# Patient Record
Sex: Female | Born: 1960 | Race: White | Hispanic: Yes | Marital: Single | Smoking: Never smoker
Health system: Southern US, Community
[De-identification: ages and names within clinical notes are randomized; demographics above are authoritative.]

## PROBLEM LIST (undated history)

## (undated) DIAGNOSIS — M1990 Unspecified osteoarthritis, unspecified site: Secondary | ICD-10-CM

## (undated) DIAGNOSIS — F329 Major depressive disorder, single episode, unspecified: Secondary | ICD-10-CM

## (undated) DIAGNOSIS — K219 Gastro-esophageal reflux disease without esophagitis: Secondary | ICD-10-CM

## (undated) DIAGNOSIS — F32A Depression, unspecified: Secondary | ICD-10-CM

## (undated) DIAGNOSIS — F419 Anxiety disorder, unspecified: Secondary | ICD-10-CM

## (undated) DIAGNOSIS — T7840XA Allergy, unspecified, initial encounter: Secondary | ICD-10-CM

## (undated) HISTORY — DX: Unspecified osteoarthritis, unspecified site: M19.90

## (undated) HISTORY — DX: Gastro-esophageal reflux disease without esophagitis: K21.9

## (undated) HISTORY — DX: Depression, unspecified: F32.A

## (undated) HISTORY — DX: Anxiety disorder, unspecified: F41.9

## (undated) HISTORY — DX: Major depressive disorder, single episode, unspecified: F32.9

## (undated) HISTORY — DX: Allergy, unspecified, initial encounter: T78.40XA

---

## 1987-12-22 HISTORY — PX: CHOLECYSTECTOMY: SHX55

## 1999-01-14 ENCOUNTER — Emergency Department (HOSPITAL_COMMUNITY): Admission: EM | Admit: 1999-01-14 | Discharge: 1999-01-14 | Payer: Self-pay | Admitting: Emergency Medicine

## 1999-05-12 ENCOUNTER — Encounter: Payer: Self-pay | Admitting: Family Medicine

## 1999-05-12 ENCOUNTER — Ambulatory Visit (HOSPITAL_COMMUNITY): Admission: RE | Admit: 1999-05-12 | Discharge: 1999-05-12 | Payer: Self-pay | Admitting: Family Medicine

## 1999-06-10 ENCOUNTER — Encounter (INDEPENDENT_AMBULATORY_CARE_PROVIDER_SITE_OTHER): Payer: Self-pay

## 1999-06-10 ENCOUNTER — Other Ambulatory Visit: Admission: RE | Admit: 1999-06-10 | Discharge: 1999-06-10 | Payer: Self-pay | Admitting: *Deleted

## 2000-07-26 ENCOUNTER — Other Ambulatory Visit: Admission: RE | Admit: 2000-07-26 | Discharge: 2000-07-26 | Payer: Self-pay | Admitting: *Deleted

## 2000-08-16 ENCOUNTER — Encounter: Payer: Self-pay | Admitting: *Deleted

## 2000-08-16 ENCOUNTER — Encounter: Admission: RE | Admit: 2000-08-16 | Discharge: 2000-08-16 | Payer: Self-pay | Admitting: *Deleted

## 2001-07-18 ENCOUNTER — Other Ambulatory Visit: Admission: RE | Admit: 2001-07-18 | Discharge: 2001-07-18 | Payer: Self-pay | Admitting: *Deleted

## 2001-07-21 ENCOUNTER — Ambulatory Visit (HOSPITAL_COMMUNITY): Admission: RE | Admit: 2001-07-21 | Discharge: 2001-07-21 | Payer: Self-pay | Admitting: *Deleted

## 2001-07-21 ENCOUNTER — Encounter: Payer: Self-pay | Admitting: *Deleted

## 2001-07-29 ENCOUNTER — Encounter: Admission: RE | Admit: 2001-07-29 | Discharge: 2001-07-29 | Payer: Self-pay | Admitting: *Deleted

## 2001-07-29 ENCOUNTER — Encounter: Payer: Self-pay | Admitting: *Deleted

## 2013-06-29 LAB — HM MAMMOGRAPHY: HM Mammogram: NEGATIVE

## 2014-12-12 ENCOUNTER — Encounter: Payer: Self-pay | Admitting: Family Medicine

## 2014-12-12 ENCOUNTER — Encounter: Payer: Self-pay | Admitting: Gastroenterology

## 2014-12-12 ENCOUNTER — Ambulatory Visit (INDEPENDENT_AMBULATORY_CARE_PROVIDER_SITE_OTHER): Payer: BC Managed Care – PPO | Admitting: Family Medicine

## 2014-12-12 VITALS — BP 118/70 | HR 75 | Temp 98.4°F | Resp 16 | Ht 61.5 in | Wt 181.0 lb

## 2014-12-12 DIAGNOSIS — Z1211 Encounter for screening for malignant neoplasm of colon: Secondary | ICD-10-CM

## 2014-12-12 DIAGNOSIS — Z23 Encounter for immunization: Secondary | ICD-10-CM

## 2014-12-12 DIAGNOSIS — Z131 Encounter for screening for diabetes mellitus: Secondary | ICD-10-CM

## 2014-12-12 DIAGNOSIS — F329 Major depressive disorder, single episode, unspecified: Secondary | ICD-10-CM | POA: Insufficient documentation

## 2014-12-12 DIAGNOSIS — Z0000 Encounter for general adult medical examination without abnormal findings: Secondary | ICD-10-CM

## 2014-12-12 DIAGNOSIS — F32A Depression, unspecified: Secondary | ICD-10-CM | POA: Insufficient documentation

## 2014-12-12 DIAGNOSIS — E559 Vitamin D deficiency, unspecified: Secondary | ICD-10-CM

## 2014-12-12 DIAGNOSIS — Z1329 Encounter for screening for other suspected endocrine disorder: Secondary | ICD-10-CM

## 2014-12-12 DIAGNOSIS — Z130 Encounter for screening for diseases of the blood and blood-forming organs and certain disorders involving the immune mechanism: Secondary | ICD-10-CM

## 2014-12-12 DIAGNOSIS — K219 Gastro-esophageal reflux disease without esophagitis: Secondary | ICD-10-CM | POA: Insufficient documentation

## 2014-12-12 DIAGNOSIS — Z13220 Encounter for screening for lipoid disorders: Secondary | ICD-10-CM

## 2014-12-12 LAB — LIPID PANEL
CHOL/HDL RATIO: 3.6 ratio
CHOLESTEROL: 170 mg/dL (ref 0–200)
HDL: 47 mg/dL (ref >39)
LDL Cholesterol: 94 mg/dL (ref 0–99)
TRIGLYCERIDES: 145 mg/dL (ref <150)
VLDL: 29 mg/dL (ref 0–40)

## 2014-12-12 LAB — CBC
HCT: 37.3 % (ref 36.0–46.0)
Hemoglobin: 12.4 g/dL (ref 12.0–15.0)
MCH: 26.1 pg (ref 26.0–34.0)
MCHC: 33.2 g/dL (ref 30.0–36.0)
MCV: 78.4 fL (ref 78.0–100.0)
MPV: 8.1 fL — ABNORMAL LOW (ref 9.4–12.4)
Platelets: 286 10*3/uL (ref 150–400)
RBC: 4.76 MIL/uL (ref 3.87–5.11)
RDW: 16.1 % — ABNORMAL HIGH (ref 11.5–15.5)
WBC: 5.3 10*3/uL (ref 4.0–10.5)

## 2014-12-12 LAB — COMPREHENSIVE METABOLIC PANEL
ALT: 55 U/L — ABNORMAL HIGH (ref 0–35)
AST: 57 U/L — ABNORMAL HIGH (ref 0–37)
Albumin: 3.9 g/dL (ref 3.5–5.2)
Alkaline Phosphatase: 58 U/L (ref 39–117)
BUN: 8 mg/dL (ref 6–23)
CO2: 25 mEq/L (ref 19–32)
Calcium: 8.9 mg/dL (ref 8.4–10.5)
Chloride: 105 mEq/L (ref 96–112)
Creat: 0.79 mg/dL (ref 0.50–1.10)
Glucose, Bld: 81 mg/dL (ref 70–99)
Potassium: 4.5 mEq/L (ref 3.5–5.3)
Sodium: 139 mEq/L (ref 135–145)
Total Bilirubin: 0.7 mg/dL (ref 0.2–1.2)
Total Protein: 7 g/dL (ref 6.0–8.3)

## 2014-12-12 NOTE — Patient Instructions (Addendum)
Thank you for coming in for your initial visit today.  Everything on your exam looked and sounded good.  We will be in contact with your old pcp, your gastro doctor, and [facility] in order to get your medical records. We gave you the flu vaccine today. We may give you the TDap vaccine depending on what the records show. We have referred you for a colonoscopy today. We drew several labs today and will be in contact with you regarding those results. We'll see you back in a couple weeks to discuss any issues that may be ongoing.  Keeping You Healthy  Get These Tests  Blood Pressure- Have your blood pressure checked by your healthcare provider at least once a year.  Normal blood pressure is 120/80.  Weight- Have your body mass index (BMI) calculated to screen for obesity.  BMI is a measure of body fat based on height and weight.  You can calculate your own BMI at https://www.west-esparza.com/www.nhlbisupport.com/bmi/  Cholesterol- Have your cholesterol checked every year.  Diabetes- Have your blood sugar checked every year if you have high blood pressure, high cholesterol, a family history of diabetes or if you are overweight.  Pap Smear- Have a pap smear every 1 to 3 years if you have been sexually active.  If you are older than 65 and recent pap smears have been normal you may not need additional pap smears.  In addition, if you have had a hysterectomy  For benign disease additional pap smears are not necessary.  Mammogram-Yearly mammograms are essential for early detection of breast cancer  Screening for Colon Cancer- Colonoscopy starting at age 52. Screening may begin sooner depending on your family history and other health conditions.  Follow up colonoscopy as directed by your Gastroenterologist.  Screening for Osteoporosis- Screening begins at age 865 with bone density scanning, sooner if you are at higher risk for developing Osteoporosis.  Get these medicines  Calcium with Vitamin D- Your body requires 1200-1500 mg  of Calcium a day and [phone] IU of Vitamin D a day.  You can only absorb 500 mg of Calcium at a time therefore Calcium must be taken in 2 or 3 separate doses throughout the day.  Hormones- Hormone therapy has been associated with increased risk for certain cancers and heart disease.  Talk to your healthcare provider about if you need relief from menopausal symptoms.  Aspirin- Ask your healthcare provider about taking Aspirin to prevent Heart Disease and Stroke.  Get these Immuniztions  Flu shot- Every fall  Pneumonia shot- Once after the age of 52; if you are younger ask your healthcare provider if you need a pneumonia shot.  Tetanus- Every ten years.  Zostavax- Once after the age of 52 to prevent shingles.  Take these steps  Don't smoke- Your healthcare provider can help you quit. For tips on how to quit, ask your healthcare provider or go to www.smokefree.gov or call 1-800 QUIT-NOW.  Be physically active- Exercise 5 days a week for a minimum of 30 minutes.  If you are not already physically active, start slow and gradually work up to 30 minutes of moderate physical activity.  Try walking, dancing, bike riding, swimming, etc.  Eat a healthy diet- Eat a variety of healthy foods such as fruits, vegetables, whole grains, low fat milk, low fat cheeses, yogurt, lean meats, chicken, fish, eggs, dried beans, tofu, etc.  For more information go to www.thenutritionsource.org  Dental visit- Brush and floss teeth twice daily; visit your dentist twice  a year.  Eye exam- Visit your Optometrist or Ophthalmologist yearly.  Drink alcohol in moderation- Limit alcohol intake to one drink or less a day.  Never drink and drive.  Depression- Your emotional health is as important as your physical health.  If you're feeling down or losing interest in things you normally enjoy, please talk to your healthcare provider.  Seat Belts- can save your life; always wear one  Smoke/Carbon Monoxide detectors-  These detectors need to be installed on the appropriate level of your home.  Replace batteries at least once a year.  Violence- If anyone is threatening or hurting you, please tell your healthcare provider.  Living Will/ Health care power of attorney- Discuss with your healthcare provider and family.

## 2014-12-12 NOTE — Progress Notes (Signed)
   Subjective:    Patient ID: Holly Frost, female    DOB: 11/28/1961, 53 y.o.   MRN: [id number]  HPI  This 53 y.o. Female is here [facility].   Pt PROBLEM LIST, PMH, SURG Hx, FAM HX reviewed and documented by Raelyn Ensignodd McVeigh, PA-C.  Prior to Admission medications   Medication Sig Start Date End Date Taking? Authorizing Provider  DEXILANT 60 MG capsule Take 1 capsule by mouth every morning. 12/01/14  Yes Historical Provider, MD  FLUoxetine (PROZAC) 40 MG capsule Take 40 mg by mouth daily.   Yes Historical Provider, MD    Review of Systems  Constitutional: Positive for diaphoresis.  HENT: Positive for dental problem and hearing loss.   Eyes: Positive for visual disturbance.  Respiratory: Negative.   Cardiovascular: Negative.   Gastrointestinal: Negative.   Endocrine: Negative.   Genitourinary: Negative.   Musculoskeletal: Positive for joint swelling and arthralgias.  Skin: Negative.   Allergic/Immunologic: Negative.   Neurological: Negative.   Hematological: Negative.   Psychiatric/Behavioral: Negative.       Objective:   Physical Exam  Nursing note and vitals reviewed.  CPE per T. McVeigh, PA-C.  Nothing to add.     Assessment & Plan:  Annual physical exam- Agree with A/P per T. McVeigh, PA-C.  Vitamin D deficiency - Plan: Vitamin D 1,25 dihydroxy  Depression  Gastroesophageal reflux disease, esophagitis presence not specified  Need for influenza vaccination - Plan: Flu Vaccine QUAD 36+ mos IM  Screening for deficiency anemia - Plan: CBC  Screening for thyroid disorder - Plan: TSH  Screening for hyperlipidemia - Plan: Lipid panel  Screening for diabetes mellitus - Plan: Comprehensive metabolic panel  Screen for colon cancer - Plan: Ambulatory referral to Gastroenterology   Pt will follow-up w/ T. McVeigh, PA-C as her PCP.

## 2014-12-12 NOTE — Progress Notes (Signed)
Subjective:    Patient ID: Holly Frost, female    DOB: 09/04/1961, 53 y.o.   MRN: [id number]  PCP: No primary care provider on file.  Chief Complaint  Patient presents with  . Establish Care  . OTHER    joint pain kneesx 2-3 yrs,   pain hands x 1 yr  . can't hear out of left ear    "ongoing" x 2 years   Patient Active Problem List   Diagnosis Date Noted  . Depression 12/12/2014  . GERD (gastroesophageal reflux disease) 12/12/2014   Prior to Admission medications   Medication Sig Start Date End Date Taking? Authorizing Provider  DEXILANT 60 MG capsule Take 1 capsule by mouth every morning. 12/01/14  Yes Historical Provider, MD  FLUoxetine (PROZAC) 40 MG capsule Take 40 mg by mouth daily.   Yes Historical Provider, MD   Medications, allergies, past medical history, surgical history, family history, social history and problem list reviewed and updated.  HPI  [age] yof with pmh depression, anxiety, and gerd presents to clinic to [facility] care [name].  Pt was born [place]. 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 [place] but feels it is time to switch her care to our clinic. She presented to clinic today with her daughter who is also an interpretor.   She is relatively healthy. Medical hx/surgical hx/social hx all reviewed in detail with pt today.   She was diagnosed with depression and anxiety many yrs ago and has been happy on prozac 40 mg qd for several yrs. She has a hx of gerd and has been on dexilant for several yrs. She states she has had an upper endoscopy several yrs back, normal per pt. Records have been requested from both pcp and [facility].   Vaccines: Interested in flu vaccine today. Unsure if received tdap recently.   Health Maintenance: Mammo 2 yrs ago normal per pt. States she was contacted by [name] last week to  schedule another mammo. Pap done approx 2 yrs ago with [name]. Pt states no hx abn paps. Requesting pt records from [name]. Has not had a colonoscopy, interested in scheduling this.   Dentist: Every 2 yrs she goes. Has had 3 teeth pulled past few yrs and thinks she may need more. Planning to go in the new year.   Eye: [facility] yearly. Wears glasses.  Exercise: Not really. She walks her dog approx 2-3 times per week, approx 20 mins per time. She is planning to go to zumba classes in the new year.   Diet: No special diet. Does not particularly watch what she eats. Avoids spicy food and caffeine due to gerd. Avg meals: Breakfast: whole wheat poptart, 1/2 banana, 1/2 glass 2% milk. Lunch: Salad from school with low fat dressing/vegetables. Dinner: Rice/beans/fish/soup. Snacks: Chocolate. Described as her weakness. 1-2 sprite per day. Does not eat red meat.   She mentions some discomfort with knees and hands for past few years as well as decreased hearing out of left ear for past 2 yrs. Due to time constraints and pertaining full H&P at this initial visit pt instructed we will f/u with these issues at a future appt.   Review of Systems No chest pain, SOB, HA, dizziness, vision change, N/V, diarrhea, constipation, dysuria, urinary urgency or frequency, myalgias, arthralgias or rash.  Objective:   Physical Exam  Constitutional: She is oriented to person, place, and time. She appears well-developed and well-nourished.  Non-toxic appearance. She does not have a sickly appearance. She does not appear ill. No distress.  BP 118/70 mmHg  Pulse 75  Temp(Src) 98.4 F (36.9 C) (Oral)  Resp 16  Ht 5' 1.5" (1.562 m)  Wt 181 lb (82.101 kg)  BMI 33.65 kg/m2  SpO2 97%  LMP    HENT:  Right Ear: Tympanic membrane and ear canal normal.  Left Ear: Tympanic membrane and ear canal normal.  Nose: Nose normal.  Mouth/Throat: Uvula is midline, oropharynx is clear and moist and mucous membranes are normal. No  oropharyngeal exudate, posterior oropharyngeal edema, posterior oropharyngeal erythema or tonsillar abscesses.  Eyes: Conjunctivae, EOM and lids are normal. Pupils are equal, round, and reactive to light.  Neck: Trachea normal. Normal carotid pulses present. Carotid bruit is not present. No thyroid mass and no thyromegaly present.  Cardiovascular: Normal rate, regular rhythm and normal heart sounds.  Exam reveals no gallop.   No murmur heard. Pulses:      Dorsalis pedis pulses are 2+ on the right side, and 2+ on the left side.  Pulmonary/Chest: Effort normal and breath sounds normal. She has no decreased breath sounds. She has no wheezes. She has no rhonchi. She has no rales.  Abdominal: Soft. Normal appearance and bowel sounds are normal. There is no hepatomegaly. There is no tenderness.  Lymphadenopathy:       Head (right side): No submental, no submandibular and no tonsillar adenopathy present.       Head (left side): No submental, no submandibular and no tonsillar adenopathy present.    She has no cervical adenopathy.  Neurological: She is alert and oriented to person, place, and time. She has normal strength. No cranial nerve deficit or sensory deficit. She displays a negative Romberg sign. Coordination and gait normal.  Reflex Scores:      Patellar reflexes are 2+ on the right side and 2+ on the left side. Skin: No rash noted. No cyanosis. Nails show no clubbing.  Normal cap refill.   Psychiatric: She has a normal mood and affect. Her speech is normal.      Assessment & Plan:   77 yof with pmh depression, anxiety, and gerd presents to clinic to establish care with [name].  Annual physical exam --vitals and exam normal today --mentions joint pain (kness, hands) and left ear hearing loss which we will address at appt 12/24/14 10am --vaccines, labs, health maintenance as below --requested medical records for this new pt from prior pcp, gi, and [facility] --not due for pap at this  time, pt plans to schedule mammo next week  Need for influenza vaccination - Plan: Flu Vaccine QUAD 36+ mos IM --flu shot given --possible tdap pending medical records we receive  Screening for deficiency anemia - Plan: CBC Screening for thyroid disorder - Plan: TSH Screening for hyperlipidemia - Plan: Lipid panel Screening for diabetes mellitus - Plan: Comprehensive metabolic panel Vitamin D deficiency - Plan: Vitamin D 1,25 dihydroxy --screening baseline labs  Depression --well controlled on 40 mg prozac qd  Gastroesophageal reflux disease, esophagitis presence not specified --well controlled per pt on dexilant 60 mg qd --discuss at next appt attempt to decrease to maintenance dose 30 mg qd  Screen for colon cancer - Plan: Ambulatory referral to Gastroenterology --referred  Donnajean Lopes, PA-C Physician Assistant-Certified [facility]  12/12/2014 9:05 PM

## 2014-12-13 LAB — TSH: TSH: 1.273 u[IU]/mL (ref 0.350–4.500)

## 2014-12-15 ENCOUNTER — Encounter: Payer: Self-pay | Admitting: Family Medicine

## 2014-12-17 LAB — VITAMIN D 1,25 DIHYDROXY
VITAMIN D3 1, 25 (OH): 61 pg/mL
Vitamin D 1, 25 (OH)2 Total: 61 pg/mL (ref 18–72)
Vitamin D2 1, 25 (OH)2: <8 pg/mL

## 2014-12-18 NOTE — Progress Notes (Signed)
Appointment has been made per request.

## 2014-12-24 ENCOUNTER — Ambulatory Visit: Payer: BC Managed Care – PPO

## 2014-12-24 ENCOUNTER — Encounter: Payer: Self-pay | Admitting: Physician Assistant

## 2014-12-24 ENCOUNTER — Ambulatory Visit (INDEPENDENT_AMBULATORY_CARE_PROVIDER_SITE_OTHER): Payer: BLUE CROSS/BLUE SHIELD | Admitting: Physician Assistant

## 2014-12-24 ENCOUNTER — Ambulatory Visit (INDEPENDENT_AMBULATORY_CARE_PROVIDER_SITE_OTHER): Payer: BLUE CROSS/BLUE SHIELD

## 2014-12-24 VITALS — BP 113/76 | HR 91 | Temp 98.6°F | Resp 16 | Ht 61.25 in | Wt 183.8 lb

## 2014-12-24 DIAGNOSIS — M79641 Pain in right hand: Secondary | ICD-10-CM

## 2014-12-24 DIAGNOSIS — K219 Gastro-esophageal reflux disease without esophagitis: Secondary | ICD-10-CM

## 2014-12-24 DIAGNOSIS — M25562 Pain in left knee: Principal | ICD-10-CM

## 2014-12-24 DIAGNOSIS — R748 Abnormal levels of other serum enzymes: Secondary | ICD-10-CM

## 2014-12-24 DIAGNOSIS — M25561 Pain in right knee: Secondary | ICD-10-CM

## 2014-12-24 DIAGNOSIS — M1712 Unilateral primary osteoarthritis, left knee: Secondary | ICD-10-CM | POA: Insufficient documentation

## 2014-12-24 DIAGNOSIS — M19042 Primary osteoarthritis, left hand: Secondary | ICD-10-CM

## 2014-12-24 DIAGNOSIS — M79643 Pain in unspecified hand: Secondary | ICD-10-CM

## 2014-12-24 DIAGNOSIS — R945 Abnormal results of liver function studies: Secondary | ICD-10-CM

## 2014-12-24 DIAGNOSIS — M19041 Primary osteoarthritis, right hand: Secondary | ICD-10-CM

## 2014-12-24 DIAGNOSIS — M79642 Pain in left hand: Secondary | ICD-10-CM

## 2014-12-24 DIAGNOSIS — R7989 Other specified abnormal findings of blood chemistry: Secondary | ICD-10-CM | POA: Insufficient documentation

## 2014-12-24 LAB — RHEUMATOID FACTOR

## 2014-12-24 MED ORDER — DICLOFENAC SODIUM 1 % TD GEL: 2.0000 g | Freq: Four times a day (QID) | TRANSDERMAL | Status: DC

## 2014-12-24 NOTE — Progress Notes (Signed)
Subjective:    Patient ID: Holly Frost, female    DOB: 08/19/1961, 54 y.o.   MRN: [id number]  PCP: No primary care provider on file.  Chief Complaint  Patient presents with  . Follow-up    lab work   Patient Active Problem List   Diagnosis Date Noted  . Osteoarthritis 12/24/2014  . Elevated LFTs 12/24/2014  . Depression 12/12/2014  . GERD (gastroesophageal reflux disease) 12/12/2014   Prior to Admission medications   Medication Sig Start Date End Date Taking? Authorizing Provider  DEXILANT 60 MG capsule Take 1 capsule by mouth every morning. 12/01/14  Yes Historical Provider, MD  FLUoxetine (PROZAC) 40 MG capsule Take 40 mg by mouth daily.   Yes Historical Provider, MD  diclofenac sodium (VOLTAREN) 1 % GEL Apply 2 g topically 4 (four) times daily. 12/24/14   Raelyn Ensign, PA   Medications, allergies, past medical history, surgical history, family history, social history and problem list reviewed and updated.  HPI  45 yof who transferred her care to [place] and was seen on 12/12/14 for initial appt. CPE, labs, immunizations done at that time. She returns today to discuss knee and hand pain.   Hands have bothered her for several years. They are painful over PIP joints bilaterally first thing in the morning, gradually improves over several hours. It is also painful when she tries to do too much housework, cooking, etc  Her knees have bothered her for several years. Painful after too much sitting or when going up/downstairs. No trauma to knee. She thinks she has arthritis in her knees and hands.   Labs from 12/12/14 were all normal except mild elevation in ast/alt. Drinks occasional alcohol. She doesn't eat red meat. Lot of sweets, snacks.   She is planning to schedule mammo in the next couple weeks. She heard from GI recently and will be scheduling her colonoscopy soon.   She is on  dexilant for her gerd. She states today that she has tried 30 mg and has also tried different  ppis. No relief in gerd sx other than with 60 mg dexilant qd.   She is planning to start Zumba this year.   Review of Systems No CP, SOB, fever, chills.     Objective:   Physical Exam  Constitutional: She is oriented to person, place, and time. She appears well-developed and well-nourished.  Non-toxic appearance. She does not have a sickly appearance. She does not appear ill. No distress.  BP 113/76 mmHg  Pulse 91  Temp(Src) 98.6 F (37 C) (Oral)  Resp 16  Ht 5' 1.25" (1.556 m)  Wt 183 lb 12.8 oz (83.371 kg)  BMI 34.43 kg/m2  SpO2 98%   Musculoskeletal:       Right knee: She exhibits normal range of motion, no swelling, no effusion, no LCL laxity, normal meniscus and no MCL laxity. Tenderness found. Medial joint line tenderness noted. No lateral joint line, no MCL, no LCL and no patellar tendon tenderness noted.       Left knee: She exhibits normal range of motion, no swelling, no effusion, no LCL laxity, no bony tenderness, normal meniscus and no MCL laxity. Tenderness found. Medial joint line tenderness noted. No lateral joint line, no MCL, no LCL and no patellar tendon tenderness noted.       Right hand: She exhibits bony tenderness. She exhibits normal range of motion and normal capillary refill. Normal sensation noted. Normal strength noted.       Left  hand: She exhibits bony tenderness. She exhibits normal range of motion and normal capillary refill. Normal sensation noted. Normal strength noted.  Normal strength testing knees bilaterally. Normal sensation. 2+ DP pulses.   Hands with TTP bilaterally over all 8 PIP joints. Mild-mod swelling noted 3rd/4th digit PIP left hand.   Neurological: She is alert and oriented to person, place, and time.  Psychiatric: She has a normal mood and affect. Her speech is normal.   [facility] reading (PRIMARY) by  Dr. Patsy Lager. Findings: As below.  Right hand: Mild joint space narrowing PIP joints. No acute injury.  Left hand: Mild-mod joint space  narrowing PIP joints. No acute injury.  Right knee: No acute injury. Mild medial joint space narrowing.  Left knee: No acute injury. Mild medial joint space narrowing.      Assessment & Plan:   18 yof who transferred her care to [place] and was seen on 12/12/14 for initial appt. CPE, labs, immunizations done at that time. She returns today to discuss knee and hand pain.   Knee pain, bilateral - Plan: DG Knee Complete 4 Views Left, DG Knee Complete 4 Views Right --most likely from oa as exam normal other than medial joint line ttp --rec weight loss/moderate aerobic exercise --tylenol 650 tid prn --voltaren gel 4x/day as needed  --option of joint injx/ortho referreal in future  Hand joint pain, unspecified laterality - Plan: Sedimentation Rate, Rheumatoid factor, Cyclic Citrul Peptide Antibody, IGG, DG Hand 2 View Left, DG Hand 2 View Right, diclofenac sodium (VOLTAREN) 1 % GEL Primary osteoarthritis of both hands --xrays show likely pip joint oa. 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

## 2014-12-24 NOTE — Patient Instructions (Addendum)
I will put in an order for you to come by and have your liver levels drawn again. Please plan to do this in 3-6 months time. After you get your labs drawn let's schedule an appointment for a few weeks later.  Your xrays showed that you have osteoarthritis in your hands and a little bit in your knees. Please take tylenol 650 mg every 6 hours as needed for pain. Please apply the voltaren gel every 6 hours as needed to the hands for pain. Losing weight and moderate exercise like walking and riding a stationary bike will also help with the knee arthritis.  I'll plan to see you in 6 months after your liver enzymes are drawn.   Osteoarthritis Osteoarthritis is a disease that causes soreness and inflammation of a joint. It occurs when the cartilage at the affected joint wears down. Cartilage acts as a cushion, covering the ends of bones where they meet to form a joint. Osteoarthritis is the most common form of arthritis. It often occurs in older people. The joints affected most often by this condition include those in the:  Ends of the fingers.  Thumbs.  Neck.  Lower back.  Knees.  Hips. CAUSES  Over time, the cartilage that covers the ends of bones begins to wear away. This causes bone to rub on bone, producing pain and stiffness in the affected joints.  RISK FACTORS Certain factors can increase your chances of having osteoarthritis, including:  Older age.  Excessive body weight.  Overuse of joints.  Previous joint injury. SIGNS AND SYMPTOMS   Pain, swelling, and stiffness in the joint.  Over time, the joint may lose its normal shape.  Small deposits of bone (osteophytes) may grow on the edges of the joint.  Bits of bone or cartilage can break off and float inside the joint space. This may cause more pain and damage. DIAGNOSIS  Your health care provider will do a physical exam and ask about your symptoms. Various tests may be ordered, such as:  X-rays of the affected joint.  An  MRI scan.  Blood tests to rule out other types of arthritis.  Joint fluid tests. This involves using a needle to draw fluid from the joint and examining the fluid under a microscope. TREATMENT  Goals of treatment are to control pain and improve joint function. Treatment plans may include:  A prescribed exercise program that allows for rest and joint relief.  A weight control plan.  Pain relief techniques, such as:  Properly applied heat and cold.  Electric pulses delivered to nerve endings under the skin (transcutaneous electrical nerve stimulation [TENS]).  Massage.  Certain nutritional supplements.  Medicines to control pain, such as:  Acetaminophen.  Nonsteroidal anti-inflammatory drugs (NSAIDs), such as naproxen.  Narcotic or central-acting agents, such as tramadol.  Corticosteroids. These can be given orally or as an injection.  Surgery to reposition the bones and relieve pain (osteotomy) or to remove loose pieces of bone and cartilage. Joint replacement may be needed in advanced states of osteoarthritis. HOME CARE INSTRUCTIONS   Take medicines only as directed by your health care provider.  Maintain a healthy weight. Follow your health care provider's instructions for weight control. This may include dietary instructions.  Exercise as directed. Your health care provider can recommend specific types of exercise. These may include:  Strengthening exercises. These are done to strengthen the muscles that support joints affected by arthritis. They can be performed with weights or with exercise bands to add  resistance.  Aerobic activities. These are exercises, such as brisk walking or low-impact aerobics, that get your heart pumping.  Range-of-motion activities. These keep your joints limber.  Balance and agility exercises. These help you maintain daily living skills.  Rest your affected joints as directed by your health care provider.  Keep all follow-up visits as  directed by your health care provider. SEEK MEDICAL CARE IF:   Your skin turns red.  You develop a rash in addition to your joint pain.  You have worsening joint pain.  You have a fever along with joint or muscle aches. SEEK IMMEDIATE MEDICAL CARE IF:  You have a significant loss of weight or appetite.  You have night sweats. FOR MORE INFORMATION   National Institute of Arthritis and Musculoskeletal and Skin Diseases: www.niams.http://www.myers.net/  [employer] on Aging: https://walker.com/  American College of Rheumatology: www.rheumatology.org Document Released: 12/07/2005 Document Revised: 04/23/2014 Document Reviewed: 08/14/2013 [facility] Patient Information 2015 [place], [place]. This information is not intended to replace advice given to you by your health care provider. Make sure you discuss any questions you have with your health care provider.

## 2014-12-25 ENCOUNTER — Telehealth: Payer: Self-pay

## 2014-12-25 DIAGNOSIS — M79641 Pain in right hand: Secondary | ICD-10-CM | POA: Insufficient documentation

## 2014-12-25 DIAGNOSIS — M25562 Pain in left knee: Principal | ICD-10-CM

## 2014-12-25 DIAGNOSIS — M25561 Pain in right knee: Secondary | ICD-10-CM | POA: Insufficient documentation

## 2014-12-25 DIAGNOSIS — M79642 Pain in left hand: Secondary | ICD-10-CM

## 2014-12-25 LAB — SEDIMENTATION RATE: Sed Rate: 4 mm/hr (ref 0–22)

## 2014-12-25 NOTE — Telephone Encounter (Signed)
PA needed for voltaren gel. Called pt to see if she has tried any oral NSAIDS, and she reported that she has not. I sent PA with note that with pt's severe GERD and need to be on high doses of PPI, provider doesn't want to Rx an oral NSAID w/higher risk of adverse GI SEs. Completed on covermymeds. Pending.

## 2014-12-26 LAB — CYCLIC CITRUL PEPTIDE ANTIBODY, IGG: Cyclic Citrullin Peptide Ab: <2 U/mL (ref 0.0–5.0)

## 2014-12-26 NOTE — Telephone Encounter (Signed)
PA approved through 12/25/15. Pharm notified. Exp Scripts notified pt.

## 2015-01-16 ENCOUNTER — Encounter: Payer: Self-pay | Admitting: *Deleted

## 2015-01-17 ENCOUNTER — Other Ambulatory Visit: Payer: Self-pay | Admitting: Physician Assistant

## 2015-01-18 NOTE — Telephone Encounter (Signed)
Todd, I don't see that you have Rxd this for pt before, but you did discuss it w/her at her CPE 12/12/14. Do you want to RF?

## 2015-01-18 NOTE — Telephone Encounter (Signed)
Pt called to have prozac refilled. I have never prescribed this for her but she has been on for many yrs and is happy with this current dose. Refilled 6 month supply.

## 2015-02-01 ENCOUNTER — Encounter: Payer: Self-pay | Admitting: Gastroenterology

## 2015-07-18 ENCOUNTER — Other Ambulatory Visit: Payer: Self-pay | Admitting: Physician Assistant

## 2015-08-26 ENCOUNTER — Other Ambulatory Visit: Payer: Self-pay | Admitting: Physician Assistant

## 2015-09-02 IMAGING — CR DG HAND 2V*R*
2 series · 2 of 2 positions shown · non-contrast
Comparison: None.

CLINICAL DATA: Left hand pain.

EXAM:
RIGHT HAND - 2 VIEW

[lateral]
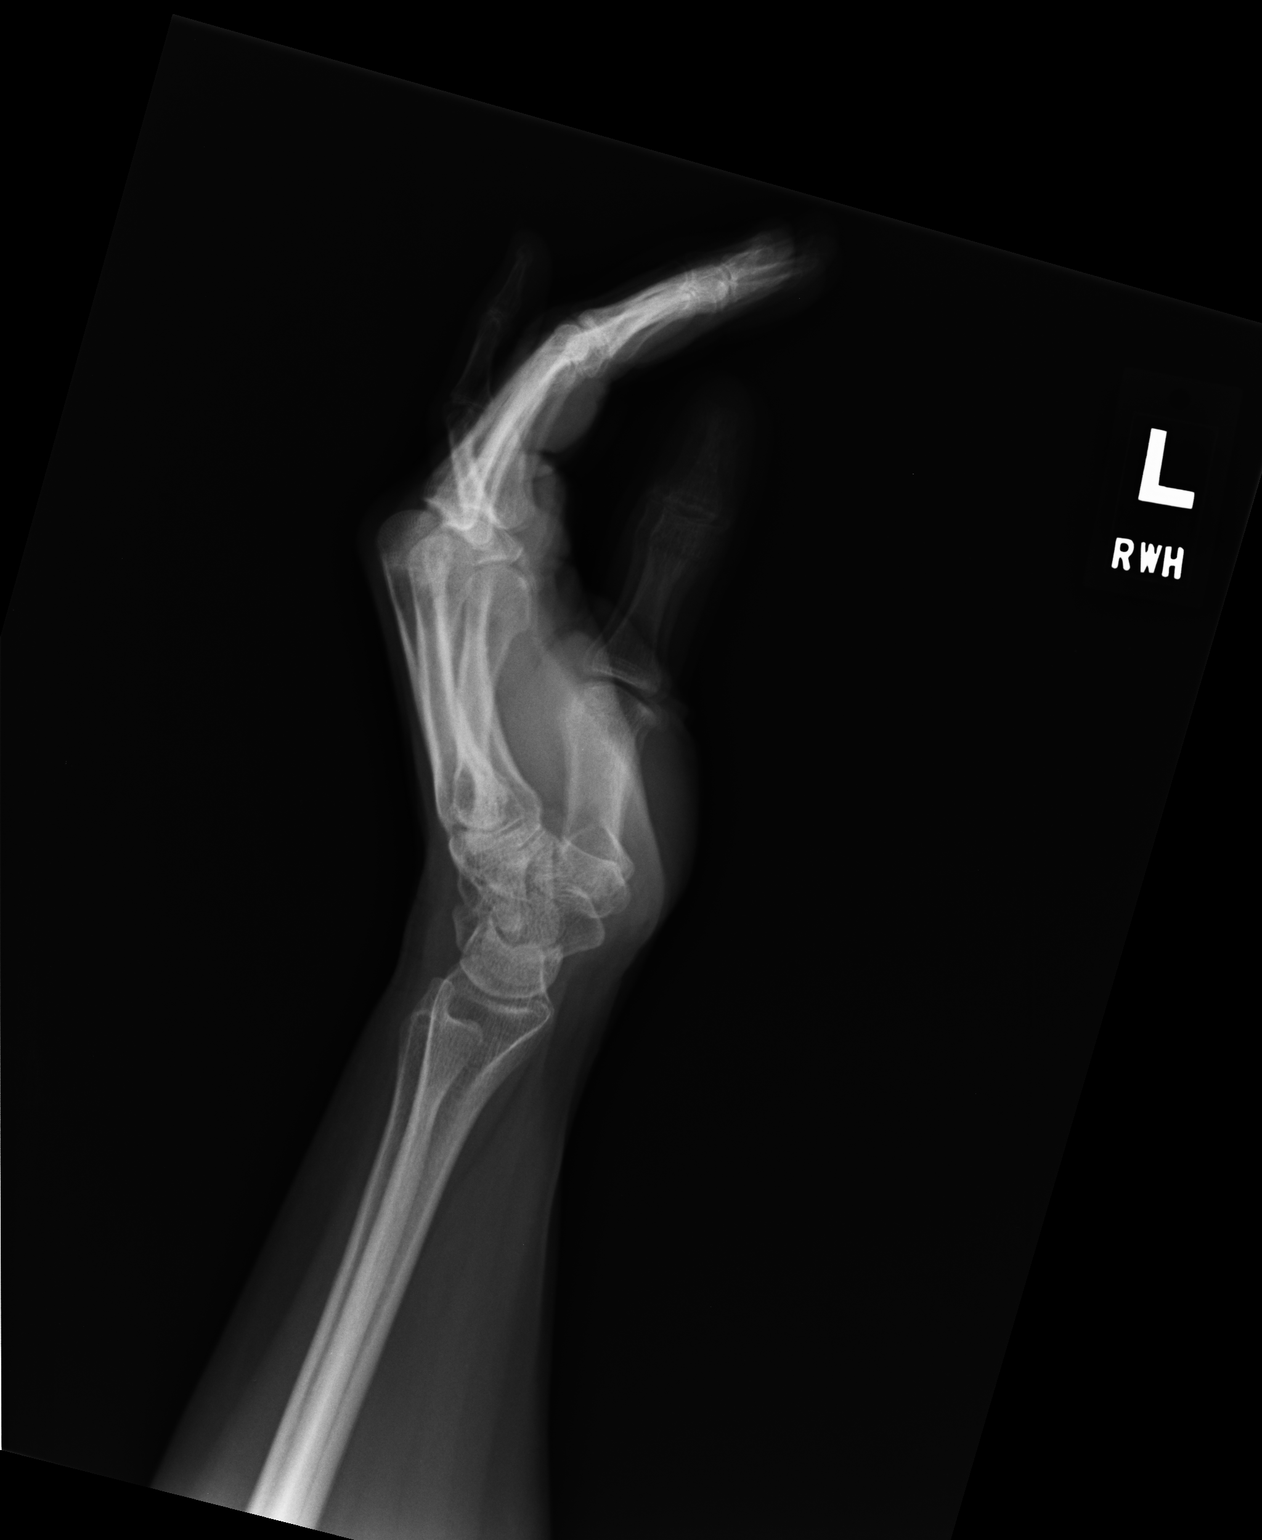

[PA]
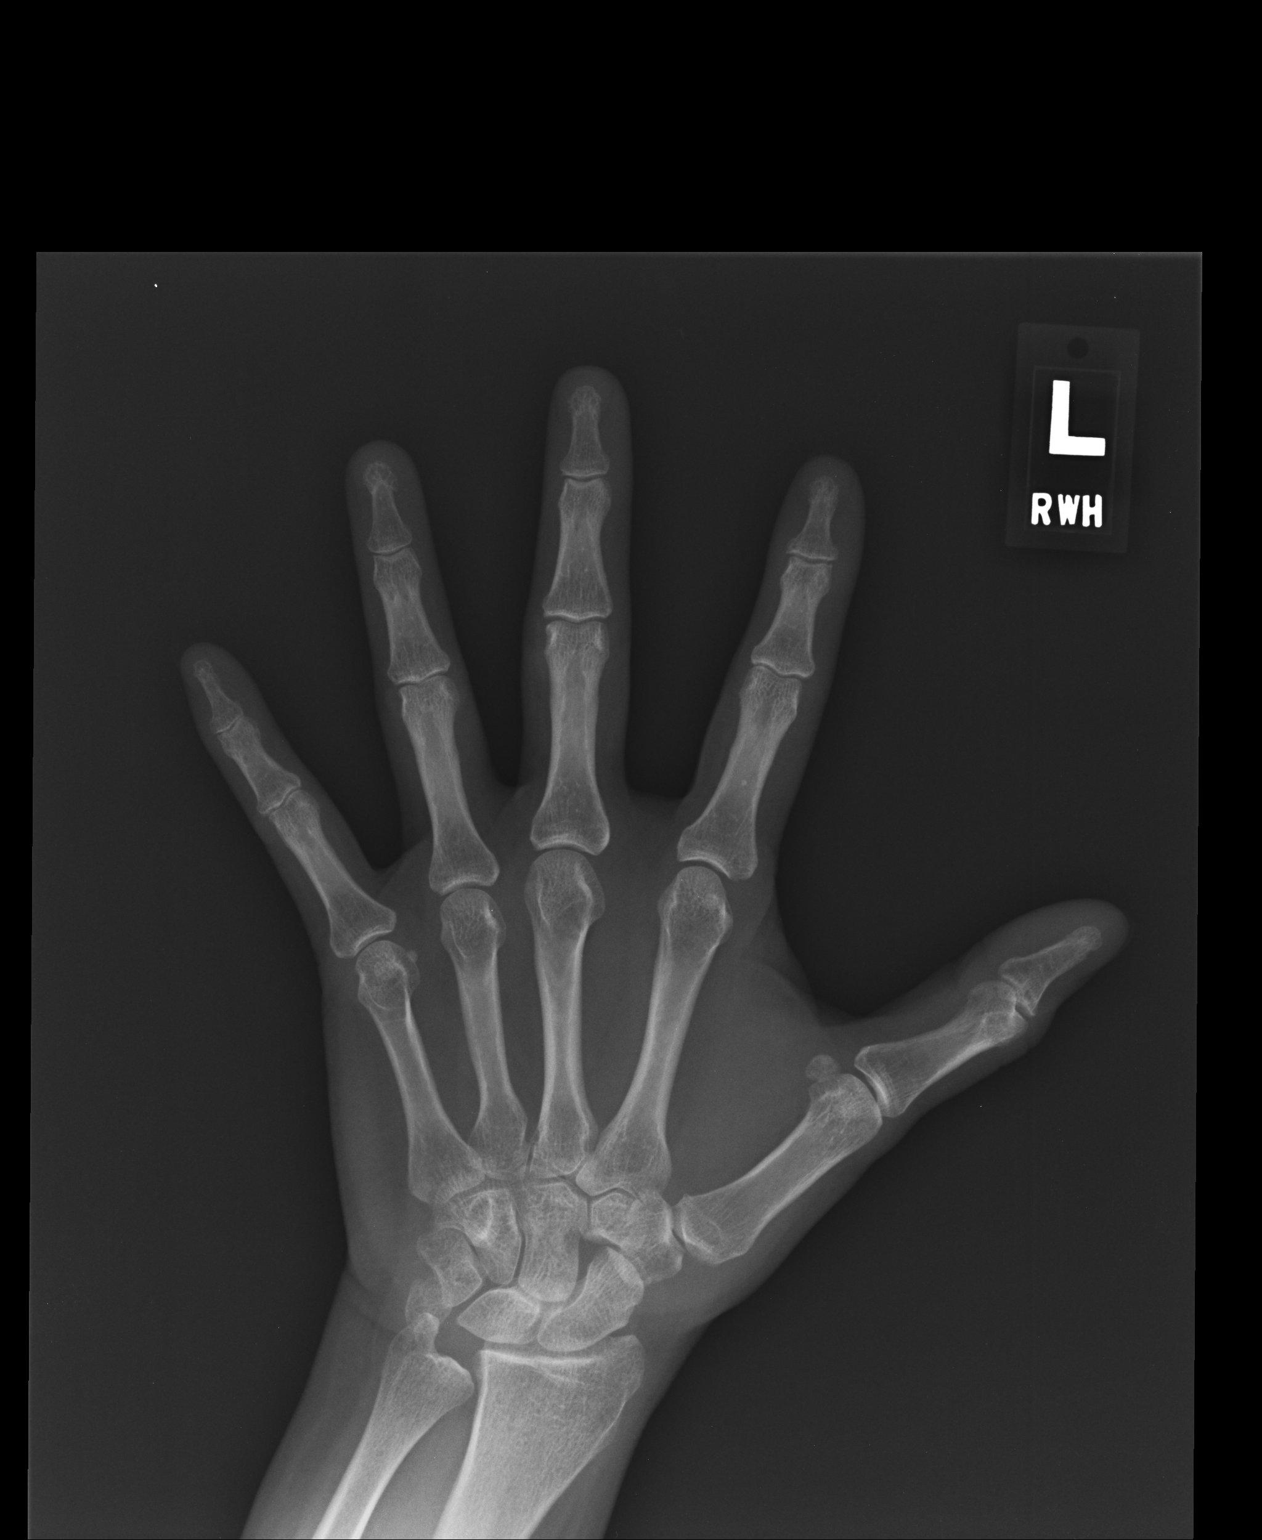

[2 of 2 positions shown; findings below may reference images not displayed]

FINDINGS: No acute bony or joint abnormality is identified. Joint spaces are
preserved. No erosion or periostitis is seen. Soft tissues are
unremarkable.
IMPRESSION: Negative exam.

## 2015-09-02 IMAGING — CR DG KNEE COMPLETE 4+V*L*
3 series · 3 of 3 positions shown · non-contrast
Comparison: None.

CLINICAL DATA: 53-year-old female with bilateral knee pain. Initial
encounter.

EXAM:
LEFT KNEE - COMPLETE 4+ VIEW

[other]
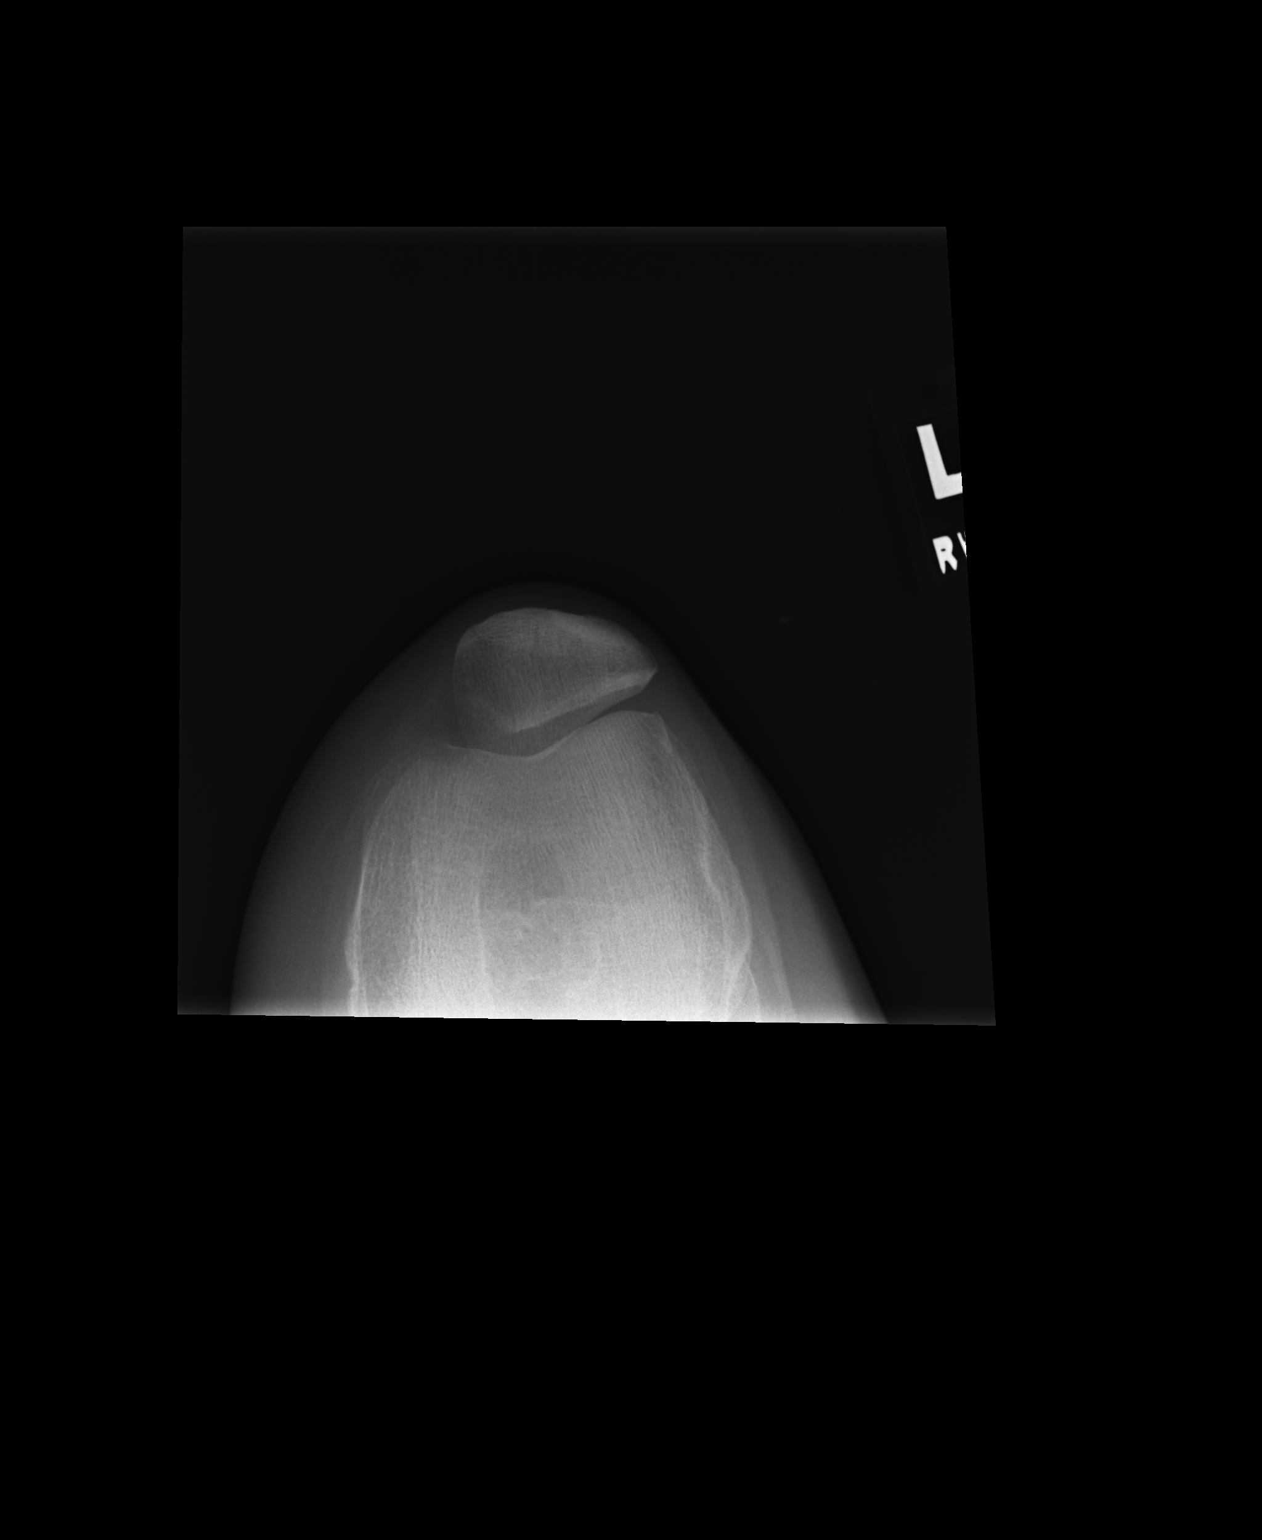

[lateral]
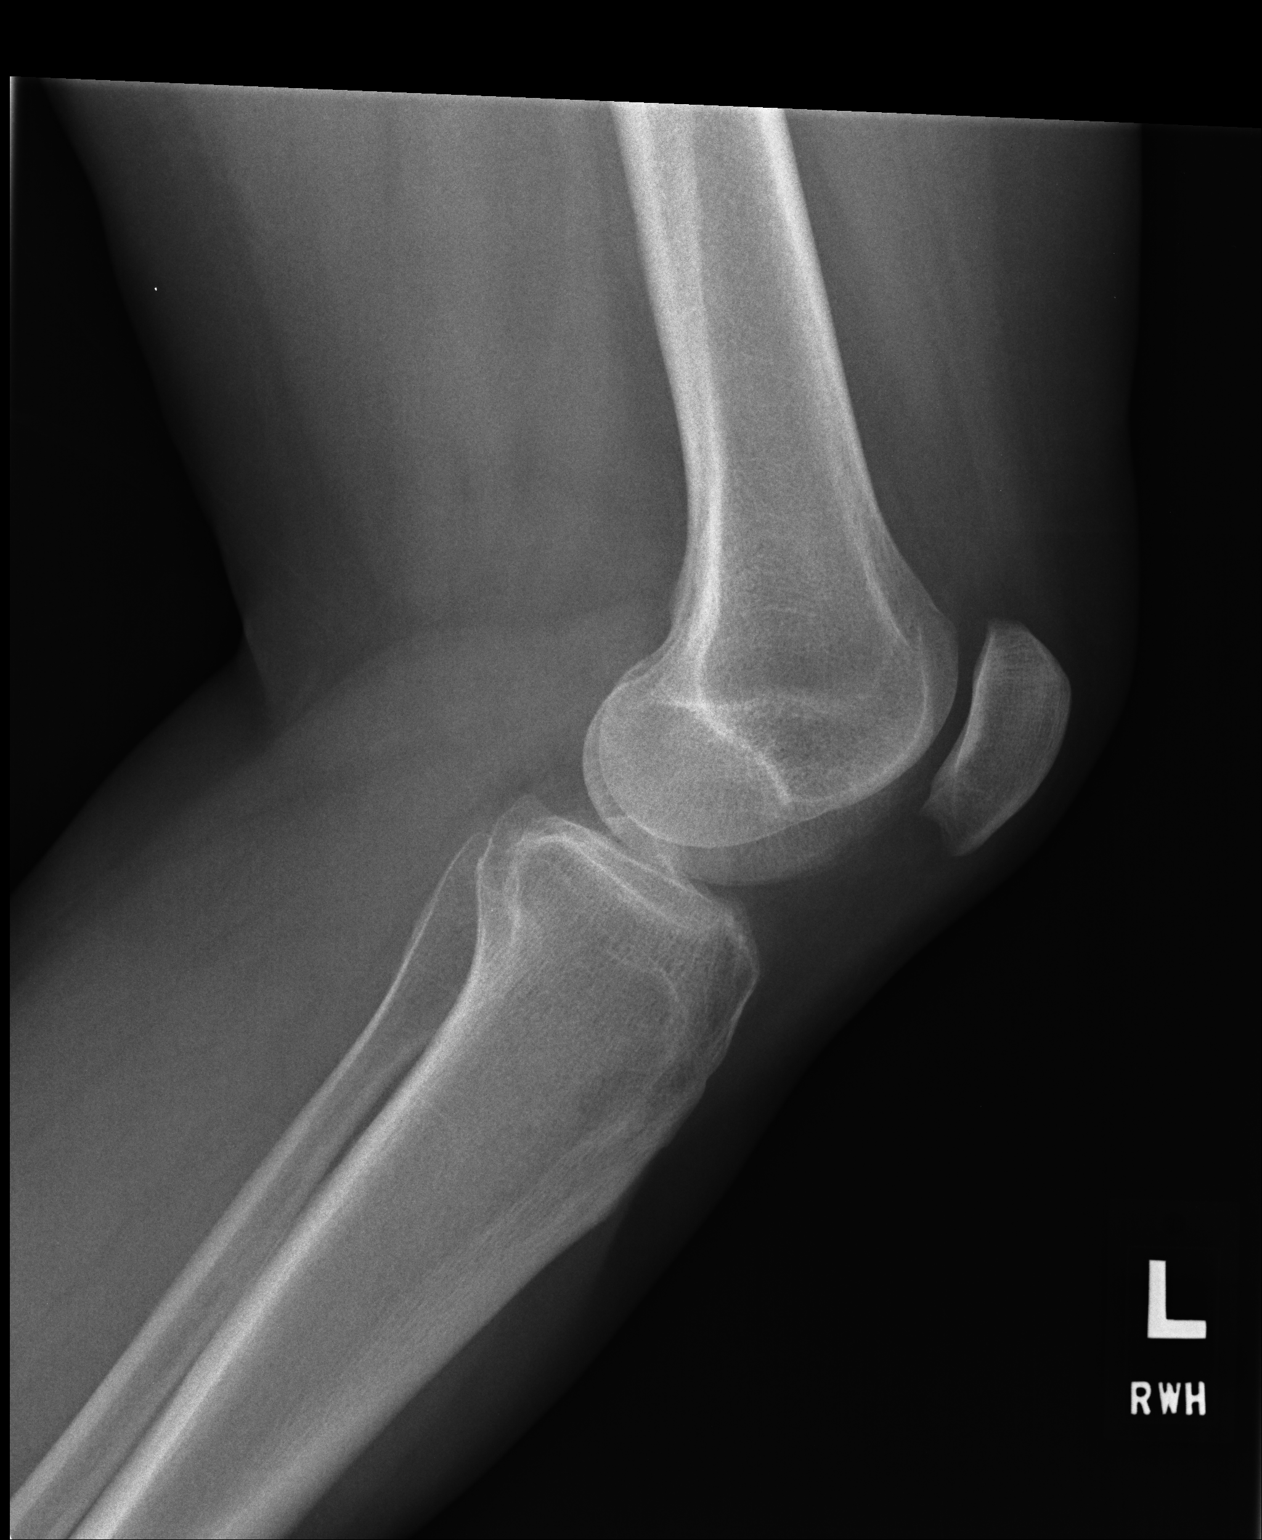

[AP]
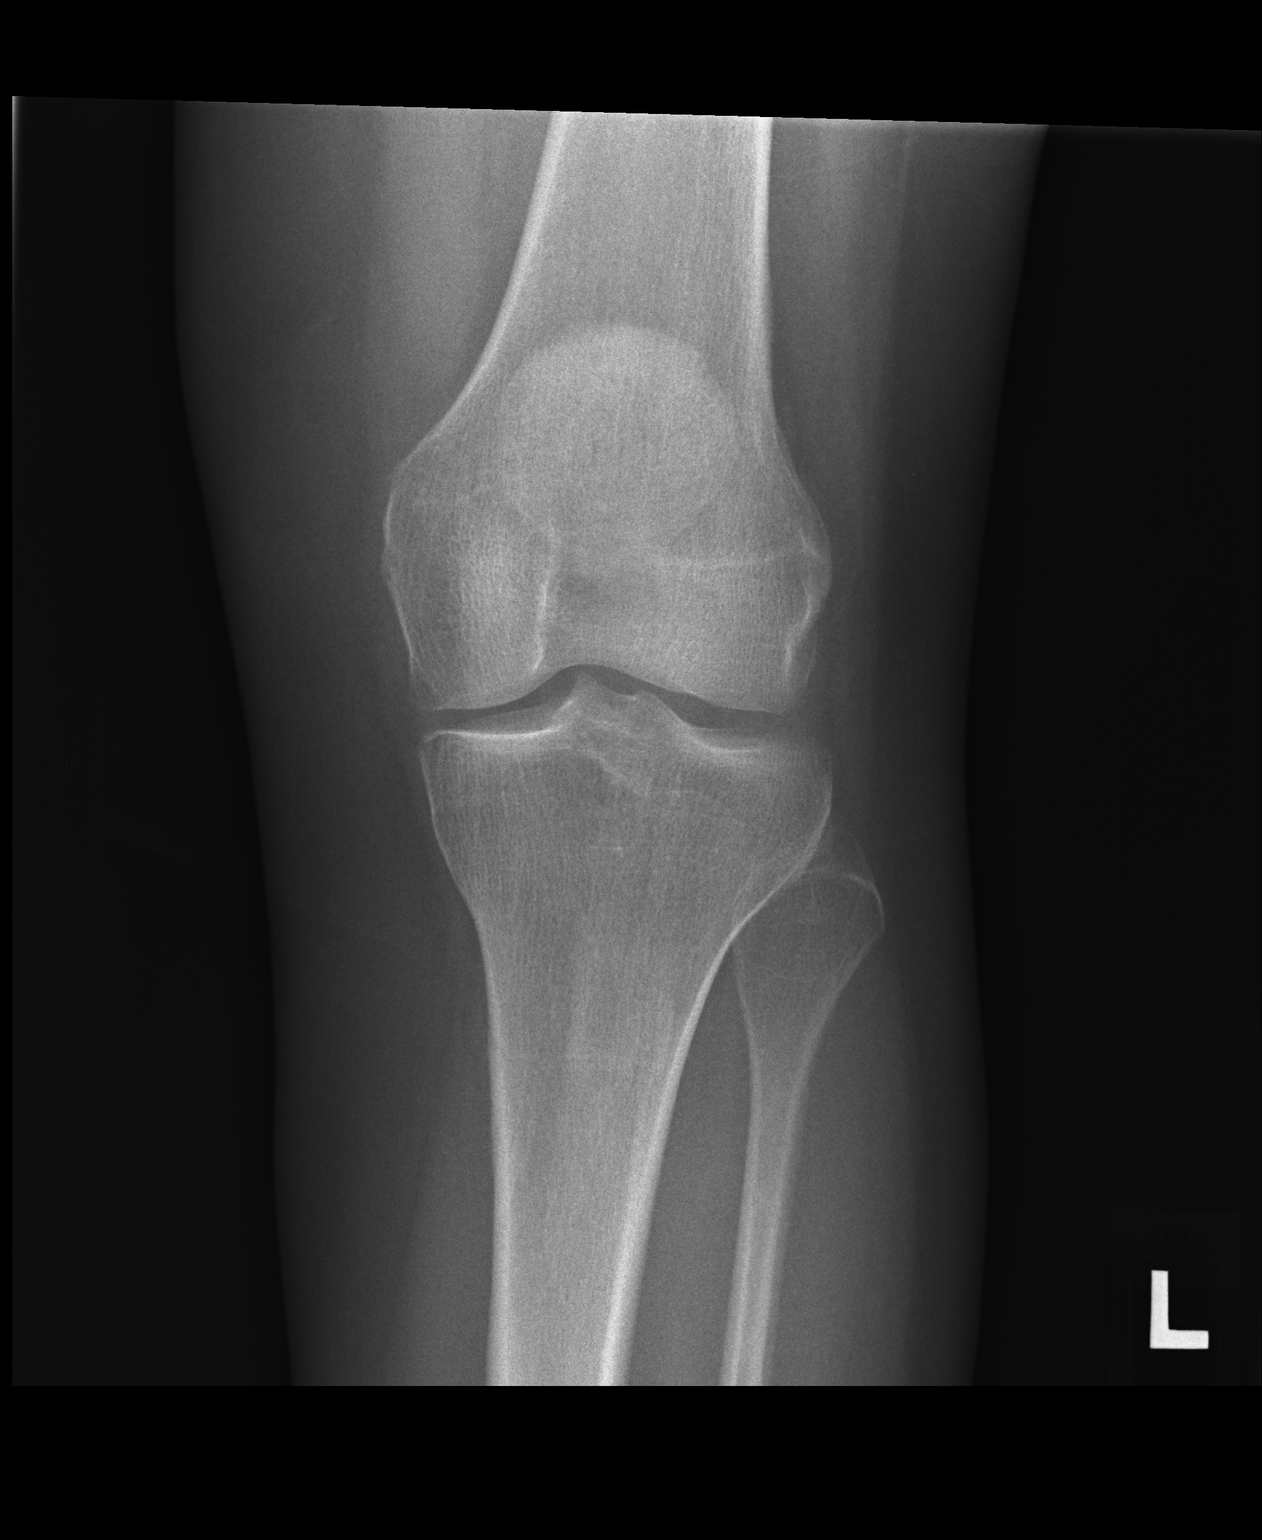

[3 of 3 positions shown; findings below may reference images not displayed]

FINDINGS: Bone mineralization is within normal limits. Mild medial compartment
joint space loss. Other Joint spaces preserved. No joint effusion.
Patella intact. No acute fracture or dislocation.
IMPRESSION: No acute osseous abnormality identified at the left knee.

## 2015-09-22 ENCOUNTER — Other Ambulatory Visit: Payer: Self-pay | Admitting: Physician Assistant

## 2015-09-23 ENCOUNTER — Other Ambulatory Visit: Payer: Self-pay | Admitting: Physician Assistant

## 2015-09-27 ENCOUNTER — Other Ambulatory Visit: Payer: Self-pay | Admitting: Physician Assistant

## 2015-10-23 ENCOUNTER — Other Ambulatory Visit: Payer: Self-pay | Admitting: Physician Assistant

## 2015-11-25 ENCOUNTER — Other Ambulatory Visit: Payer: Self-pay | Admitting: Physician Assistant

## 2015-11-26 NOTE — Telephone Encounter (Signed)
LMOM for pt advising need for f/up and asked to RTC or call with f/up plan.

## 2015-12-03 NOTE — Telephone Encounter (Signed)
Never heard back from pt. LMOM that pt needs to RTC or call in w/f/up plan and we will be happy to help her w/Rxing this medication, but can not give more RFs until we hear from her. Sent back message to pharm.

## 2015-12-05 ENCOUNTER — Ambulatory Visit (INDEPENDENT_AMBULATORY_CARE_PROVIDER_SITE_OTHER): Payer: BC Managed Care – PPO | Admitting: Family Medicine

## 2015-12-05 VITALS — BP 118/74 | HR 96 | Temp 98.5°F | Resp 18 | Ht 61.25 in | Wt 188.2 lb

## 2015-12-05 DIAGNOSIS — J040 Acute laryngitis: Secondary | ICD-10-CM

## 2015-12-05 MED ORDER — PREDNISONE 20 MG PO TABS: ORAL_TABLET | ORAL | Status: DC

## 2015-12-05 MED ORDER — AZITHROMYCIN 250 MG PO TABS: ORAL_TABLET | ORAL | Status: DC

## 2015-12-05 NOTE — Progress Notes (Signed)
By signing my name below, I, Holly Frost, attest that this documentation has been prepared under the direction and in the presence of Holly Sidle, MD.  Watt Climes Frost, Medical Scribe. 12/05/2015.  9:16 AM.  Patient ID: Holly Frost MRN: [id number], DOB: 03-01-1961, 54 y.o. Date of Encounter: 12/05/2015  Primary Physician: No primary care provider on file.  Chief Complaint:  Chief Complaint  Patient presents with  . Cough    x2 days, green phlegm no fever  . Laryngitis    x2 days   . Medication Refill    Prozac     HPI:  Holly Frost is a 54 y.o. female who presents to [facility] complaining of cough, gradual onset yesterday.  Pt has associate symptoms of cough. She states that she was not able to go to work yesterday due to the symptoms. Pt denies fevers.   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     Past Medical History  Diagnosis Date  . Allergy   . Anxiety   . Depression   . GERD (gastroesophageal reflux disease)   . Arthritis      Home Meds: Prior to Admission medications   Medication Sig Start Date End Date Taking? Authorizing Provider  DEXILANT 60 MG capsule Take 1 capsule by mouth every morning. 12/01/14  Yes Historical Provider, MD  FLUoxetine (PROZAC) 40 MG capsule TAKE ONE CAPSULE BY MOUTH EVERY DAY   (NO MORE REFILLS WITHOUT OFFICE VISIT) 2ND 09/28/15  Yes Lanier Clam V, PA-C  diclofenac sodium (VOLTAREN) 1 % GEL Apply 2 g topically 4 (four) times daily. Patient not taking: Reported on 12/05/2015 12/24/14   Raelyn Ensign, PA    Allergies: No Known Allergies  Social History   Social History  . Marital Status: Single    Spouse Name: N/A  . Number of Children: N/A  . Years of Education: N/A   Occupational History  . Spanish Interpreter    Social History Main Topics  . Smoking status: Never Smoker   . Smokeless tobacco: Not on file  . Alcohol Use: 1.2 oz/week    1 Shots of liquor, 1 Standard drinks  or equivalent per week     Comment: Rarely socially  . Drug Use: No  . Sexual Activity: Not Currently     Comment: Once every 3 months with husband   Other Topics Concern  . Not on file   Social History Narrative   Married for four years. Married previously for 21 years. 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      Review of Systems: Constitutional: negative for chills, fever, night sweats, weight changes, or fatigue  HEENT: negative for hearing loss, congestion, rhinorrhea, ST, epistaxis, or sinus pressure. Positive for voice change.  Cardiovascular: negative for chest pain or palpitations Respiratory: negative for hemoptysis, wheezing, shortness of breath. Positive for cough.  Abdominal: negative for abdominal pain, nausea, vomiting, diarrhea, or constipation Dermatological: negative for rash Neurologic: negative for headache, dizziness, or syncope All other systems reviewed and are otherwise negative with the exception to those above and in the HPI.  Physical Exam: Blood pressure 118/74, pulse 96, temperature 98.5 F (36.9 C), temperature source Oral, resp. rate 18, height 5' 1.25" (1.556 m), weight 188 lb 3.2 oz (85.367 kg), SpO2 94 %., Body mass index is 35.26 kg/(m^2). General: Well developed, well nourished, in no acute distress. Horace voice.  Head: Normocephalic, atraumatic, eyes without discharge, sclera  non-icteric, nares are without discharge. Bilateral auditory canals clear, TM's are without perforation, pearly grey and translucent with reflective cone of light bilaterally. Oral cavity moist, posterior pharynx without exudate, erythema, peritonsillar abscess, or post nasal drip.  Neck: Supple. No thyromegaly. Full ROM. No lymphadenopathy. Lungs: No wheezes, rales. Breathing is unlabored. Few rhonchi.  Heart: RRR with S1 S2. No murmurs, rubs, or gallops appreciated. Abdomen: Soft, non-tender, non-distended with normoactive bowel sounds. No  hepatomegaly. No rebound/guarding. No obvious abdominal masses. Msk:  Strength and tone normal for age. Extremities/Skin: Warm and dry. No clubbing or cyanosis. No edema. No rashes or suspicious lesions. Neuro: Alert and oriented X 3. Moves all extremities spontaneously. Gait is normal. CNII-XII grossly in tact. Psych:  Responds to questions appropriately with a normal affect.    ASSESSMENT AND PLAN:  54 y.o. year old female with    This chart was scribed in my presence and reviewed by me personally.    ICD-9-CM ICD-10-CM   1. Laryngitis 464.00 J04.0 azithromycin (ZITHROMAX) 250 MG tablet     predniSONE (DELTASONE) 20 MG tablet    Signed, Holly SidleKurt Embry Manrique, MD 12/05/2015 9:11 AM

## 2015-12-05 NOTE — Patient Instructions (Signed)
Laringitis  (Laryngitis)  La laringitis es la inflamación de las cuerdas vocales. Esto provoca ronquera, tos, pérdida de la voz, dolor de garganta o sequedad en la garganta. Las cuerdas vocales son dos pares de músculos que se encuentran en la garganta. Al hablar, estas cuerdas se juntan y vibran. Estas vibraciones salen por la boca como sonido. Cuando las cuerdas vocales se inflaman, la voz suena diferente.  La laringitis puede ser temporal (aguda) o de larga duración (crónica). La mayoría de los casos de laringitis aguda mejoran con el tiempo. La laringitis crónica es la laringitis que dura más de tres semanas.  CAUSAS  Las causas de la laringitis aguda pueden ser las siguientes:  · Infecciones virales.  · Hablar, gritar o cantar mucho. Esto se denomina también esfuerzo vocal.  · Infecciones bacterianas.  Las causas de la laringitis crónica pueden ser las siguientes:  · Esfuerzo vocal.  · Lesión en las cuerdas vocales.  · Reflujo ácido (enfermedad por reflujo gastroesofágico o ERGE).  · Alergias.  · Infecciones de los senos paranasales.  · Fumar.  · El consumo excesivo de alcohol.  · Respirar sustancias químicas o polvo.  · Tumores en las cuerdas vocales.  FACTORES DE RIESGO  Entre los factores de riesgo de la laringitis se incluyen los siguientes:  · Fumar.  · El consumo excesivo de alcohol.  · Tener alergias.  SIGNOS Y SÍNTOMAS  Los síntomas de la laringitis pueden incluir los siguientes:  · Voz baja y ronca.  · Pérdida de la voz.  · Tos seca  · Dolor de garganta.  · Nariz tapada.  DIAGNÓSTICO  La laringitis puede diagnosticarse mediante:  · Examen físico.  · Cultivo de secreciones de la garganta.  · Análisis de sangre.  · Laringoscopia. Este procedimiento le permite al médico observar las cuerdas vocales con un espejo o un tubo de observación.  TRATAMIENTO  El tratamiento de la laringitis depende de su causa. Por lo general, incluye descansar la voz y usar medicamentos para aliviar la garganta. Sin embargo, si  la laringitis se debe a una infección bacteriana, es posible que deba tomar antibióticos. Si la laringitis se debe a un tumor, es posible que deba someterse a un procedimiento para extirparlo.  INSTRUCCIONES PARA EL CUIDADO EN EL HOGAR  · Beba suficiente líquido para mantener la orina clara o de color amarillo pálido.  · Inhale aire húmedo. Use un humidificador si vive en un clima seco.  · Tome los medicamentos solamente como se lo haya indicado el médico.  · Si le recetaron antibióticos, asegúrese de terminarlos, incluso si comienza a sentirse mejor.  · Nofume cigarrillos ni cigarrillos electrónicos. Si necesita ayuda para dejar de fumar, consulte al médico.  · Hable lo menos posible. Evite también susurrar ya que puede provocar esfuerzo vocal.  · Escriba en vez de hablar. Hágalo hasta que su voz vuelva a la normalidad.  SOLICITE ATENCIÓN MÉDICA SI:  · Tiene fiebre.  · Aumenta el dolor.  · Tiene dificultad para tragar.  SOLICITE ATENCIÓN MÉDICA DE INMEDIATO SI:  · Tose y escupe sangre.  · Tiene dificultad para respirar.     Esta información no tiene como fin reemplazar el consejo del médico. Asegúrese de hacerle al médico cualquier pregunta que tenga.     Document Released: 09/16/2005 Document Revised: 12/28/2014  Elsevier Interactive Patient Education ©2016 Elsevier Inc.

## 2015-12-16 ENCOUNTER — Other Ambulatory Visit: Payer: Self-pay | Admitting: Physician Assistant

## 2016-01-01 ENCOUNTER — Telehealth: Payer: Self-pay

## 2016-01-01 MED ORDER — FLUOXETINE HCL 40 MG PO CAPS: ORAL_CAPSULE | ORAL | Status: DC

## 2016-01-01 NOTE — Telephone Encounter (Signed)
Rx sent, left on pt;s vm.

## 2016-01-01 NOTE — Telephone Encounter (Signed)
Pt states she tried to get a refill on her PROZAC and it was denied and she was just seen and didn't understand. Please call [phone]

## 2016-01-06 ENCOUNTER — Ambulatory Visit (INDEPENDENT_AMBULATORY_CARE_PROVIDER_SITE_OTHER): Payer: BC Managed Care – PPO | Admitting: Urgent Care

## 2016-01-06 VITALS — BP 118/72 | HR 93 | Temp 98.3°F | Resp 18 | Ht 61.25 in | Wt 190.0 lb

## 2016-01-06 DIAGNOSIS — F32A Depression, unspecified: Secondary | ICD-10-CM

## 2016-01-06 DIAGNOSIS — R7989 Other specified abnormal findings of blood chemistry: Secondary | ICD-10-CM | POA: Diagnosis not present

## 2016-01-06 DIAGNOSIS — R945 Abnormal results of liver function studies: Secondary | ICD-10-CM

## 2016-01-06 DIAGNOSIS — F329 Major depressive disorder, single episode, unspecified: Secondary | ICD-10-CM | POA: Diagnosis not present

## 2016-01-06 LAB — COMPREHENSIVE METABOLIC PANEL
ALBUMIN: 4 g/dL (ref 3.6–5.1)
ALT: 48 U/L — ABNORMAL HIGH (ref 6–29)
AST: 52 U/L — ABNORMAL HIGH (ref 10–35)
Alkaline Phosphatase: 63 U/L (ref 33–130)
BUN: 8 mg/dL (ref 7–25)
CHLORIDE: 104 mmol/L (ref 98–110)
CO2: 28 mmol/L (ref 20–31)
CREATININE: 0.68 mg/dL (ref 0.50–1.05)
Calcium: 9 mg/dL (ref 8.6–10.4)
Glucose, Bld: 77 mg/dL (ref 65–99)
POTASSIUM: 3.9 mmol/L (ref 3.5–5.3)
Sodium: 139 mmol/L (ref 135–146)
Total Bilirubin: 0.6 mg/dL (ref 0.2–1.2)
Total Protein: 6.7 g/dL (ref 6.1–8.1)

## 2016-01-06 MED ORDER — FLUOXETINE HCL 40 MG PO CAPS: ORAL_CAPSULE | ORAL | Status: DC

## 2016-01-06 MED ORDER — FLUOXETINE HCL 20 MG PO CAPS: 20.0000 mg | ORAL_CAPSULE | Freq: Every day | ORAL | Status: DC

## 2016-01-06 NOTE — Progress Notes (Signed)
    MRN: [id number] DOB: 11/01/1961  Subjective:   Holly Frost is a 55 y.o. female presenting for chief complaint of Medication Refill and Depression  Reports 15 year history of depression and anxiety, managed with fluoxetine. Patient admits that she has good success with this medication. Patient has had difficulty within her marriage and identifies this as the source of her depression and anxiety. Patient has since divorced and remarried. Patient ran out of her medication in the past 2 months. She needs a refill today. She tried to go without it but admits that she is starting to feel down again, has decreased energy, is having a harding time enjoying activities she normally enjoys. She denies homicidal or suicidal ideation. Denies difficulty sleeping, slowed speech, agitation or irritability, guilt.  Maureen RalphsVivian has a current medication list which includes the following prescription(s): dexilant and fluoxetine. Also has No Known Allergies.  Maureen RalphsVivian  has a past medical history of Allergy; Anxiety; Depression; GERD (gastroesophageal reflux disease); and Arthritis. Also  has past surgical history that includes Cholecystectomy (1989).  Objective:   Vitals: BP 118/72 mmHg  Pulse 93  Temp(Src) 98.3 F (36.8 C) (Oral)  Resp 18  Ht 5' 1.25" (1.556 m)  Wt 190 lb (86.183 kg)  BMI 35.60 kg/m2  SpO2 98%  Wt Readings from Last 3 Encounters:  01/06/16 190 lb (86.183 kg)  12/05/15 188 lb 3.2 oz (85.367 kg)  12/24/14 183 lb 12.8 oz (83.371 kg)   Physical Exam  Constitutional: She is oriented to person, place, and time. She appears well-developed and well-nourished.  HENT:  Mouth/Throat: Oropharynx is clear and moist.  Eyes: No scleral icterus.  Neck: Normal range of motion. Neck supple. No thyromegaly present.  Cardiovascular: Normal rate, regular rhythm and intact distal pulses.  Exam reveals no gallop and no friction rub.   No murmur heard. Pulmonary/Chest: No respiratory distress. She has no  wheezes. She has no rales.  Abdominal: Soft. Bowel sounds are normal. She exhibits no distension and no mass. There is no tenderness.  Neurological: She is alert and oriented to person, place, and time.  Skin: Skin is warm and dry.   Assessment and Plan :   1. Depression 2. Elevated LFTs - Will restart fluoxetine at 20mg  and increase to 40mg  (her previous therapeutic dose) in 3-4 weeks. Recheck if she has trouble with the medication. LFT recheck pending.  Wallis BambergMario Chesni Vos, PA-C [facility] [phone] 01/06/2016 1:35 PM

## 2016-01-06 NOTE — Patient Instructions (Signed)
- Please start with 20mg  capsules to avoid experiencing side effects. If in 3-4 weeks, you are tolerating the medication well, then switch to the 40mg  capsules.   Fluoxetine capsules or tablets (Depression/Mood Disorders) What is this medicine? FLUOXETINE (floo OX e teen) belongs to a class of drugs known as selective serotonin reuptake inhibitors (SSRIs). It helps to treat mood problems such as depression, obsessive compulsive disorder, and panic attacks. It can also treat certain eating disorders. This medicine may be used for other purposes; ask your health care provider or pharmacist if you have questions. What should I tell my health care provider before I take this medicine? They need to know if you have any of these conditions: -bipolar disorder or mania -diabetes -glaucoma -liver disease -psychosis -seizures -suicidal thoughts or history of attempted suicide -an unusual or allergic reaction to fluoxetine, other medicines, foods, dyes, or preservatives -pregnant or trying to get pregnant -breast-feeding How should I use this medicine? Take this medicine by mouth with a glass of water. Follow the directions on the prescription label. You can take this medicine with or without food. Take your medicine at regular intervals. Do not take it more often than directed. Do not stop taking this medicine suddenly except upon the advice of your doctor. Stopping this medicine too quickly may cause serious side effects or your condition may worsen. A special MedGuide will be given to you by the pharmacist with each prescription and refill. Be sure to read this information carefully each time. Talk to your pediatrician regarding the use of this medicine in children. While this drug may be prescribed for children as young as 7 years for selected conditions, precautions do apply. Overdosage: If you think you have taken too much of this medicine contact a poison control center or emergency room at  once. NOTE: This medicine is only for you. Do not share this medicine with others. What if I miss a dose? If you miss a dose, skip the missed dose and go back to your regular dosing schedule. Do not take double or extra doses. What may interact with this medicine? Do not take fluoxetine with any of the following medications: -other medicines containing fluoxetine, like Sarafem or Symbyax -cisapride -linezolid -MAOIs like Carbex, Eldepryl, Marplan, Nardil, and Parnate -methylene blue (injected into a vein) -pimozide -thioridazine This medicine may also interact with the following medications: -alcohol -aspirin and aspirin-like medicines -carbamazepine -certain medicines for depression, anxiety, or psychotic disturbances -certain medicines for migraine headaches like almotriptan, eletriptan, frovatriptan, naratriptan, rizatriptan, sumatriptan, zolmitriptan -digoxin -diuretics -fentanyl -flecainide -furazolidone -isoniazid -lithium -medicines for sleep -medicines that treat or prevent blood clots like warfarin, enoxaparin, and dalteparin -NSAIDs, medicines for pain and inflammation, like ibuprofen or naproxen -phenytoin -procarbazine -propafenone -rasagiline -ritonavir -supplements like St. John's wort, kava kava, valerian -tramadol -tryptophan -vinblastine This list may not describe all possible interactions. Give your health care provider a list of all the medicines, herbs, non-prescription drugs, or dietary supplements you use. Also tell them if you smoke, drink alcohol, or use illegal drugs. Some items may interact with your medicine. What should I watch for while using this medicine? Tell your doctor if your symptoms do not get better or if they get worse. Visit your doctor or health care professional for regular checks on your progress. Because it may take several weeks to see the full effects of this medicine, it is important to continue your treatment as prescribed by  your doctor. Patients and their families should watch out  for new or worsening thoughts of suicide or depression. Also watch out for sudden changes in feelings such as feeling anxious, agitated, panicky, irritable, hostile, aggressive, impulsive, severely restless, overly excited and hyperactive, or not being able to sleep. If this happens, especially at the beginning of treatment or after a change in dose, call your health care professional. Bonita QuinYou may get drowsy or dizzy. Do not drive, use machinery, or do anything that needs mental alertness until you know how this medicine affects you. Do not stand or sit up quickly, especially if you are an older patient. This reduces the risk of dizzy or fainting spells. Alcohol may interfere with the effect of this medicine. Avoid alcoholic drinks. Your mouth may get dry. Chewing sugarless gum or sucking hard candy, and drinking plenty of water may help. Contact your doctor if the problem does not go away or is severe. This medicine may affect blood sugar levels. If you have diabetes, check with your doctor or health care professional before you change your diet or the dose of your diabetic medicine. What side effects may I notice from receiving this medicine? Side effects that you should report to your doctor or health care professional as soon as possible: -allergic reactions like skin rash, itching or hives, swelling of the face, lips, or tongue -breathing problems -confusion -eye pain, changes in vision -fast or irregular heart rate, palpitations -flu-like fever, chills, cough, muscle or joint aches and pains -seizures -suicidal thoughts or other mood changes -swelling or redness in or around the eye -tremors -trouble sleeping -unusual bleeding or bruising -unusually tired or weak -vomiting Side effects that usually do not require medical attention (report to your doctor or health care professional if they continue or are bothersome): -change in sex  drive or performance -diarrhea -dry mouth -flushing -headache -increased or decreased appetite -nausea -sweating This list may not describe all possible side effects. Call your doctor for medical advice about side effects. You may report side effects to FDA at 1-800-FDA-1088. Where should I keep my medicine? Keep out of the reach of children. Store at room temperature between 15 and 30 degrees C (59 and 86 degrees F). Throw away any unused medicine after the expiration date. NOTE: This sheet is a summary. It may not cover all possible information. If you have questions about this medicine, talk to your doctor, pharmacist, or health care provider.    2016, Elsevier/Gold Standard. (2014-11-30 12:40:07)

## 2016-01-07 ENCOUNTER — Encounter: Payer: Self-pay | Admitting: Urgent Care

## 2016-02-01 ENCOUNTER — Other Ambulatory Visit: Payer: Self-pay | Admitting: Urgent Care

## 2016-04-29 ENCOUNTER — Ambulatory Visit (INDEPENDENT_AMBULATORY_CARE_PROVIDER_SITE_OTHER): Payer: BC Managed Care – PPO

## 2016-04-29 ENCOUNTER — Ambulatory Visit (INDEPENDENT_AMBULATORY_CARE_PROVIDER_SITE_OTHER): Payer: BC Managed Care – PPO | Admitting: Physician Assistant

## 2016-04-29 VITALS — BP 122/78 | HR 104 | Temp 98.4°F | Resp 18 | Ht 61.25 in | Wt 191.0 lb

## 2016-04-29 DIAGNOSIS — M25562 Pain in left knee: Secondary | ICD-10-CM

## 2016-04-29 DIAGNOSIS — S86812A Strain of other muscle(s) and tendon(s) at lower leg level, left leg, initial encounter: Secondary | ICD-10-CM | POA: Diagnosis not present

## 2016-04-29 DIAGNOSIS — S86912A Strain of unspecified muscle(s) and tendon(s) at lower leg level, left leg, initial encounter: Secondary | ICD-10-CM

## 2016-04-29 MED ORDER — CELECOXIB 100 MG PO CAPS: 100.0000 mg | ORAL_CAPSULE | Freq: Two times a day (BID) | ORAL | Status: DC

## 2016-04-29 NOTE — Patient Instructions (Addendum)
  Rest and ice    IF you received an x-ray today, you will receive an invoice from [facility]. Please contact [facility] at [phone] with questions or concerns regarding your invoice.   IF you received labwork today, you will receive an invoice from [facility]. Please contact [facility] at [phone] with questions or concerns regarding your invoice.   Our billing staff will not be able to assist you with questions regarding bills from these companies.  You will be contacted with the lab results as soon as they are available. The fastest way to get your results is to activate your My Chart account. Instructions are located on the last page of this paperwork. If you have not heard from us regarding the results in 2 weeks, please contact this office.

## 2016-04-29 NOTE — Progress Notes (Signed)
   Alzina S Swazil[place]Jordan  MRN: [id number] DOB: 08/27/1961  Subjective:  Pt presents to clinic with left knee pain that started about 1.5 weeks ago when she pushed her knee against the side of the bed and had pain.  She is having trouble extending the knee because of the pain.  She has taken a few advil but it is not helping that much.  The pain is mainly in the medial aspect of the knee.  She has been told she has knee arthritis.  Patient Active Problem List   Diagnosis Date Noted  . Knee pain, bilateral 12/25/2014  . Bilateral hand pain 12/25/2014  . Elevated LFTs 12/24/2014  . Depression 12/12/2014  . GERD (gastroesophageal reflux disease) 12/12/2014    Current Outpatient Prescriptions on File Prior to Visit  Medication Sig Dispense Refill  . DEXILANT 60 MG capsule Take 1 capsule by mouth every morning. Reported on 01/06/2016  11  . FLUoxetine (PROZAC) 40 MG capsule TAKE ONE CAPSULE BY MOUTH EVERY DAY 60 capsule 6   No current facility-administered medications on file prior to visit.    No Known Allergies  Review of Systems  Constitutional: Negative for fever and chills.  Musculoskeletal: Negative for joint swelling. Gait problem: 2nd to pain.   Objective:  BP 122/78 mmHg  Pulse 104  Temp(Src) 98.4 F (36.9 C)  Resp 18  Ht 5' 1.25" (1.556 m)  Wt 191 lb (86.637 kg)  BMI 35.78 kg/m2  SpO2 98%  Physical Exam  Constitutional: She is oriented to person, place, and time and well-developed, well-nourished, and in no distress.  HENT:  Head: Normocephalic and atraumatic.  Right Ear: Hearing and external ear normal.  Left Ear: Hearing and external ear normal.  Eyes: Conjunctivae are normal.  Neck: Normal range of motion.  Pulmonary/Chest: Effort normal.  Musculoskeletal:       Right knee: Normal.       Left knee: She exhibits decreased range of motion (decreased full extension - 2nd to pain ) and swelling (minimal medial knee). She exhibits no effusion, no erythema, normal  alignment, no LCL laxity, normal patellar mobility, no bony tenderness, normal meniscus and no MCL laxity. Tenderness found. Medial joint line tenderness noted.  Neurological: She is alert and oriented to person, place, and time. Gait normal.  Skin: Skin is warm and dry.  Psychiatric: Mood, memory, affect and judgment normal.  Vitals reviewed.  Assessment and Plan :  Left knee pain - Plan: DG Knee 1-2 Views Left, celecoxib (CELEBREX) 100 MG capsule  Knee strain, left, initial encounter   Normal xray of knee without lose of medial joint space. We will treat as a sprain with a reaction knee brace placed on the knee for comfort and NSAID started - celebrex was picked because the patient has problems with reflux - she will use ice - if she is not improving we will send to ortho for further evaluation  Benny LennertSarah Weber PA-C  [facility] 05/01/2016 5:00 PM

## 2016-05-05 ENCOUNTER — Ambulatory Visit (INDEPENDENT_AMBULATORY_CARE_PROVIDER_SITE_OTHER): Payer: BC Managed Care – PPO | Admitting: Physician Assistant

## 2016-05-05 VITALS — BP 94/65 | HR 78 | Temp 98.7°F | Resp 16 | Wt 192.8 lb

## 2016-05-05 DIAGNOSIS — S86912D Strain of unspecified muscle(s) and tendon(s) at lower leg level, left leg, subsequent encounter: Secondary | ICD-10-CM

## 2016-05-05 DIAGNOSIS — S86812D Strain of other muscle(s) and tendon(s) at lower leg level, left leg, subsequent encounter: Secondary | ICD-10-CM | POA: Diagnosis not present

## 2016-05-05 DIAGNOSIS — M25562 Pain in left knee: Secondary | ICD-10-CM

## 2016-05-05 MED ORDER — HYDROCODONE-ACETAMINOPHEN 5-325 MG PO TABS: 1.0000 | ORAL_TABLET | Freq: Three times a day (TID) | ORAL | Status: DC | PRN

## 2016-05-05 NOTE — Progress Notes (Signed)
   Holly Frost  MRN: [id number] DOB: 10/30/1961  Subjective:  Pt presents to clinic for recheck of her left knee pain.  The celebrex seems to help a little but the pain is getting worse.  The pain is all over the knee.  She can not fully extend the knee because of the pain.  She thinks that the brace is helping with the pain.    Patient Active Problem List   Diagnosis Date Noted  . Knee pain, bilateral 12/25/2014  . Bilateral hand pain 12/25/2014  . Elevated LFTs 12/24/2014  . Depression 12/12/2014  . GERD (gastroesophageal reflux disease) 12/12/2014    Current Outpatient Prescriptions on File Prior to Visit  Medication Sig Dispense Refill  . celecoxib (CELEBREX) 100 MG capsule Take 1 capsule (100 mg total) by mouth 2 (two) times daily. 60 capsule 0  . DEXILANT 60 MG capsule Take 1 capsule by mouth every morning. Reported on 01/06/2016  11  . FLUoxetine (PROZAC) 40 MG capsule TAKE ONE CAPSULE BY MOUTH EVERY DAY 60 capsule 6   No current facility-administered medications on file prior to visit.    No Known Allergies  Review of Systems Objective:  BP 94/65 mmHg  Pulse 78  Temp(Src) 98.7 F (37.1 C) (Oral)  Resp 16  Wt 192 lb 12.8 oz (87.454 kg)  SpO2 98%  Physical Exam  Constitutional: She is oriented to person, place, and time and well-developed, well-nourished, and in no distress.  HENT:  Head: Normocephalic and atraumatic.  Right Ear: Hearing and external ear normal.  Left Ear: Hearing and external ear normal.  Eyes: Conjunctivae are normal.  Neck: Normal range of motion.  Pulmonary/Chest: Effort normal.  Musculoskeletal:       Right knee: Normal.       Left knee: She exhibits decreased range of motion (not able to fully extend the knee) and abnormal meniscus (pain in internal rotation of hip and knee extension). She exhibits no LCL laxity, no bony tenderness and no MCL laxity. Tenderness found. Medial joint line tenderness noted. No lateral joint line, no MCL, no  LCL and no patellar tendon tenderness noted.  Neurological: She is alert and oriented to person, place, and time. Gait normal.  Skin: Skin is warm and dry.  Psychiatric: Mood, memory, affect and judgment normal.  Vitals reviewed.   Assessment and Plan :  Left knee pain - Plan: HYDROcodone-acetaminophen (NORCO/VICODIN) 5-325 MG tablet, Ambulatory referral to Orthopedic Surgery  Knee strain, left, subsequent encounter - Plan: HYDROcodone-acetaminophen (NORCO/VICODIN) 5-325 MG tablet, Ambulatory referral to Orthopedic Surgery   Concern for medial meniscal tear - to ortho for further evaluation - ? US to be able to look at the knee as the mechanism of injury does not seem possible to cause a tear so ? Degenerative tear to meniscus - she will continue the brace  Benny LennertSarah Joeangel Jeanpaul PA-C  [facility] 05/05/2016 12:41 PM

## 2016-05-26 ENCOUNTER — Other Ambulatory Visit: Payer: Self-pay | Admitting: Physician Assistant

## 2016-05-28 NOTE — Telephone Encounter (Signed)
Pharmacy is calling to follow up on refill request

## 2016-06-01 NOTE — Telephone Encounter (Signed)
Referral has been sent but not scheduled yet. Will give one RF until sees specialist.

## 2016-07-05 ENCOUNTER — Other Ambulatory Visit: Payer: Self-pay | Admitting: Physician Assistant

## 2017-02-03 ENCOUNTER — Other Ambulatory Visit: Payer: Self-pay | Admitting: Urgent Care

## 2017-03-05 ENCOUNTER — Other Ambulatory Visit: Payer: Self-pay | Admitting: Urgent Care

## 2017-03-05 NOTE — Telephone Encounter (Signed)
Left message to schedule visit before this supply is exhausted.  Meds ordered this encounter  Medications  . FLUoxetine (PROZAC) 40 MG capsule    Sig: TAKE ONE CAPSULE BY MOUTH EVERY DAY    Dispense:  30 capsule    Refill:  0    Please notify patient that s/he needs an office visit +/- labsfor additional refills.

## 2017-04-04 ENCOUNTER — Other Ambulatory Visit: Payer: Self-pay | Admitting: Physician Assistant

## 2017-04-05 NOTE — Telephone Encounter (Signed)
Please notify this patient that I have authorized a 30-day supply of fluoxetine. For additional fills, she needs to establish with a PCP.  She has seen Ms. Weber, PA-C the last two visits (addressed this medication at one of them), and Mr. Urban Gibson, [place] started this medication in 12/2015. I recommend that she schedule with one of them.  Meds ordered this encounter  Medications  . FLUoxetine (PROZAC) 40 MG capsule    Sig: TAKE ONE CAPSULE BY MOUTH EVERY DAY    Dispense:  30 capsule    Refill:  0

## 2017-04-17 ENCOUNTER — Ambulatory Visit (INDEPENDENT_AMBULATORY_CARE_PROVIDER_SITE_OTHER): Payer: BC Managed Care – PPO | Admitting: Physician Assistant

## 2017-04-17 VITALS — BP 117/74 | HR 97 | Temp 98.8°F | Resp 18 | Ht 61.0 in | Wt 188.5 lb

## 2017-04-17 DIAGNOSIS — F325 Major depressive disorder, single episode, in full remission: Secondary | ICD-10-CM

## 2017-04-17 MED ORDER — FLUOXETINE HCL 40 MG PO CAPS: 40.0000 mg | ORAL_CAPSULE | Freq: Every day | ORAL | 3 refills | Status: DC

## 2017-04-17 NOTE — Patient Instructions (Signed)
     IF you received an x-ray today, you will receive an invoice from [facility]. Please contact  Radiology at [phone] with questions or concerns regarding your invoice.   IF you received labwork today, you will receive an invoice from [facility]. Please contact [facility] at [phone] with questions or concerns regarding your invoice.   Our billing staff will not be able to assist you with questions regarding bills from these companies.  You will be contacted with the lab results as soon as they are available. The fastest way to get your results is to activate your My Chart account. Instructions are located on the last page of this paperwork. If you have not heard from us regarding the results in 2 weeks, please contact this office.     

## 2017-04-17 NOTE — Progress Notes (Signed)
04/17/2017 11:07 AM   DOB: 01-30-1961 / MRN: [id number]  SUBJECTIVE:  Holly Frost is a 56 y.o. female presenting for Prozac refills.  She takes 40 mg daily and her last dose was yesterday.  Tells me that without the Prozac she feels "unstable," "rushed and anxious."  It also helps her sleep in that she sleeps all night.  She does not take any other depression medications. She has one to two drinks a month. She denies any history of suicidal thoughts.   Previous HPI on 1/16 as follows. "Reports 15 year history of depression and anxiety, managed with fluoxetine. Patient admits that she has good success with this medication. Patient has had difficulty within her marriage and identifies this as the source of her depression and anxiety. Patient has since divorced and remarried. Patient ran out of her medication in the past 2 months. She needs a refill today. She tried to go without it but admits that she is starting to feel down again, has decreased energy, is having a harding time enjoying activities she normally enjoys. She denies homicidal or suicidal ideation. Denies difficulty sleeping, slowed speech, agitation or irritability, guilt."    Depression screen [facility] 2/9 04/17/2017 04/17/2017 05/05/2016  Decreased Interest 0 0 0  Down, Depressed, Hopeless 0 0 0  PHQ - 2 Score 0 0 0  Altered sleeping 0 - -  Tired, decreased energy 1 - -  Change in appetite 1 - -  Feeling bad or failure about yourself  0 - -  Trouble concentrating 0 - -  Moving slowly or fidgety/restless 0 - -  Suicidal thoughts 0 - -  PHQ-9 Score 2 - -  Difficult doing work/chores - - -     She has No Known Allergies.   She  has a past medical history of Allergy; Anxiety; Arthritis; Depression; and GERD (gastroesophageal reflux disease).    She  reports that she has never smoked. She does not have any smokeless tobacco history on file. She reports that she drinks about 1.2 oz of alcohol per week . She reports that she does not  use drugs. She  reports that she does not currently engage in sexual activity. The patient  has a past surgical history that includes Cholecystectomy (1989).  Her family history includes Dementia in her father; Heart disease in her father; Hypertension in her father.  Review of Systems  Respiratory: Negative for shortness of breath.   Cardiovascular: Negative for chest pain.  Psychiatric/Behavioral: Negative for depression, memory loss, substance abuse and suicidal ideas. The patient is not nervous/anxious and does not have insomnia.     The problem list and medications were reviewed and updated by myself where necessary and exist elsewhere in the encounter.   OBJECTIVE:  BP 117/74   Pulse 97   Temp 98.8 F (37.1 C) (Oral)   Resp 18   Ht  (1.549 m)   Wt 188 lb 8 oz (85.5 kg)   SpO2 96%   BMI 35.62 kg/m   Physical Exam  Constitutional: She is active.  Non-toxic appearance.  Cardiovascular: Normal rate.   Pulmonary/Chest: Effort normal. No tachypnea.  Neurological: She is alert.  Skin: Skin is warm and dry. She is not diaphoretic. No pallor.  Psychiatric: She has a normal mood and affect. Her behavior is normal. Judgment and thought content normal.    No results found for this or any previous visit (from the past 72 hour(s)).  No results found.  ASSESSMENT AND  PLAN:  Holly Frost was seen today for medication refill.  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  Will continue for 1 year.  -     FLUoxetine (PROZAC) 40 MG capsule; Take 1 capsule (40 mg total) by mouth daily.    The patient is advised to call or return to clinic if she does not see an improvement in symptoms, or to seek the care of the closest emergency department if she worsens with the above plan.   Deliah Boston, MHS, PA-C [facility] 04/17/2017 11:07 AM

## 2017-06-30 ENCOUNTER — Other Ambulatory Visit: Payer: Self-pay | Admitting: Gastroenterology

## 2017-07-21 ENCOUNTER — Encounter (HOSPITAL_COMMUNITY): Payer: Self-pay

## 2017-07-21 ENCOUNTER — Ambulatory Visit (HOSPITAL_COMMUNITY): Admit: 2017-07-21 | Payer: Self-pay | Admitting: Gastroenterology

## 2017-07-21 SURGERY — MANOMETRY, ESOPHAGUS

## 2018-04-10 ENCOUNTER — Other Ambulatory Visit: Payer: Self-pay | Admitting: Physician Assistant

## 2018-04-10 DIAGNOSIS — F325 Major depressive disorder, single episode, in full remission: Secondary | ICD-10-CM

## 2018-04-11 NOTE — Telephone Encounter (Signed)
prozac refill Last OV: 04/17/17 Last Refill:04/17/17 #90 caps 3 RF [facility] [address] Deliah BostonMichael Clark PA

## 2018-05-12 ENCOUNTER — Other Ambulatory Visit: Payer: Self-pay | Admitting: Physician Assistant

## 2018-05-12 DIAGNOSIS — F325 Major depressive disorder, single episode, in full remission: Secondary | ICD-10-CM

## 2018-05-12 NOTE — Telephone Encounter (Signed)
Prozac 40 mg refill request  LOV 04/17/17 with Deliah Boston  Last refill:  04/11/18   #30   0 refills  [id number] [place] [place] - [address].

## 2018-06-02 ENCOUNTER — Other Ambulatory Visit: Payer: Self-pay | Admitting: Physician Assistant

## 2018-06-02 DIAGNOSIS — F325 Major depressive disorder, single episode, in full remission: Secondary | ICD-10-CM

## 2018-06-02 NOTE — Telephone Encounter (Signed)
Pt requesting refill of Prozac  LOV 04/17/17 M. Clark  [facility]  [facility]  0 refills  Spoke with pt to schedule appt prior to refill; pt states she will schedule one next few days when she returns from trip out of town. Pt states she really needs this medication.  Would like CB: [phone]

## 2018-07-03 ENCOUNTER — Other Ambulatory Visit: Payer: Self-pay | Admitting: Physician Assistant

## 2018-07-03 DIAGNOSIS — F325 Major depressive disorder, single episode, in full remission: Secondary | ICD-10-CM

## 2018-07-04 NOTE — Telephone Encounter (Addendum)
Please review request for fluoxetine 40 mg tab  NOV  0717/19  M. Clark, PA  LOV  04/17/17  [phone]

## 2018-07-05 NOTE — Telephone Encounter (Signed)
Pt has appt with Chestine Sporelark tomorrow 7/17. Refill req - Prozac sent to [facility]

## 2018-07-06 ENCOUNTER — Ambulatory Visit: Payer: BC Managed Care – PPO | Admitting: Physician Assistant

## 2018-07-06 ENCOUNTER — Other Ambulatory Visit: Payer: Self-pay

## 2018-07-06 ENCOUNTER — Encounter: Payer: Self-pay | Admitting: Physician Assistant

## 2018-07-06 VITALS — BP 107/71 | HR 78 | Temp 98.4°F | Ht 62.0 in | Wt 190.4 lb

## 2018-07-06 DIAGNOSIS — Z131 Encounter for screening for diabetes mellitus: Secondary | ICD-10-CM | POA: Diagnosis not present

## 2018-07-06 DIAGNOSIS — K219 Gastro-esophageal reflux disease without esophagitis: Secondary | ICD-10-CM | POA: Diagnosis not present

## 2018-07-06 DIAGNOSIS — Z1231 Encounter for screening mammogram for malignant neoplasm of breast: Secondary | ICD-10-CM

## 2018-07-06 DIAGNOSIS — F325 Major depressive disorder, single episode, in full remission: Secondary | ICD-10-CM | POA: Diagnosis not present

## 2018-07-06 DIAGNOSIS — Z13220 Encounter for screening for lipoid disorders: Secondary | ICD-10-CM

## 2018-07-06 DIAGNOSIS — Z1329 Encounter for screening for other suspected endocrine disorder: Secondary | ICD-10-CM

## 2018-07-06 DIAGNOSIS — Z1239 Encounter for other screening for malignant neoplasm of breast: Secondary | ICD-10-CM

## 2018-07-06 DIAGNOSIS — Z1211 Encounter for screening for malignant neoplasm of colon: Secondary | ICD-10-CM

## 2018-07-06 LAB — POCT GLYCOSYLATED HEMOGLOBIN (HGB A1C): HEMOGLOBIN A1C: 6.2 % — AB (ref 4.0–5.6)

## 2018-07-06 MED ORDER — FLUOXETINE HCL 40 MG PO CAPS: 40.0000 mg | ORAL_CAPSULE | Freq: Every day | ORAL | 3 refills | Status: DC

## 2018-07-06 MED ORDER — DEXLANSOPRAZOLE 60 MG PO CPDR: 1.0000 | DELAYED_RELEASE_CAPSULE | Freq: Every morning | ORAL | 11 refills | Status: DC

## 2018-07-06 NOTE — Progress Notes (Signed)
07/06/2018 9:10 AM   DOB: 09/18/1961 / MRN: [id number]  SUBJECTIVE:  Holly Frost is a 57 y.o. female presenting for refills of chornic meds. Depression and GERD doing well over all. She would like to complete some healt maintenance today.   She has No Known Allergies.   She  has a past medical history of Allergy, Anxiety, Arthritis, Depression, and GERD (gastroesophageal reflux disease).    She  reports that she has never smoked. She has never used smokeless tobacco. She reports that she drinks about 1.2 oz of alcohol per week. She reports that she does not use drugs. She  reports that she does not currently engage in sexual activity. The patient  has a past surgical history that includes Cholecystectomy (1989).  Her family history includes Dementia in her father; Heart disease in her father; Hypertension in her father.  Review of Systems  Constitutional: Negative for chills, diaphoresis and fever.  Eyes: Negative.   Respiratory: Negative for cough, hemoptysis, sputum production, shortness of breath and wheezing.   Cardiovascular: Negative for chest pain, orthopnea and leg swelling.  Gastrointestinal: Negative for abdominal pain, blood in stool, constipation, diarrhea, heartburn, melena, nausea and vomiting.  Genitourinary: Negative for dysuria, flank pain, frequency, hematuria and urgency.  Skin: Negative for rash.  Neurological: Negative for dizziness, sensory change, speech change, focal weakness and headaches.    The problem list and medications were reviewed and updated by myself where necessary and exist elsewhere in the encounter.   OBJECTIVE:  BP 107/71 (BP Location: Left Arm, Patient Position: Sitting, Cuff Size: Normal)   Pulse 78   Temp 98.4 F (36.9 C) (Oral)   Ht 5\' 2"  (1.575 m)   Wt 190 lb 6.4 oz (86.4 kg)   SpO2 96%   BMI 34.82 kg/m   Wt Readings from Last 3 Encounters:  07/06/18 190 lb 6.4 oz (86.4 kg)  04/17/17 188 lb 8 oz (85.5 kg)  05/05/16 192 lb  12.8 oz (87.5 kg)   Temp Readings from Last 3 Encounters:  07/06/18 98.4 F (36.9 C) (Oral)  04/17/17 98.8 F (37.1 C) (Oral)  05/05/16 98.7 F (37.1 C) (Oral)   BP Readings from Last 3 Encounters:  07/06/18 107/71  04/17/17 117/74  05/05/16 94/65   Pulse Readings from Last 3 Encounters:  07/06/18 78  04/17/17 97  05/05/16 78    Physical Exam  Constitutional: She is oriented to person, place, and time. She appears well-developed and well-nourished. No distress.  Eyes: Pupils are equal, round, and reactive to light. EOM are normal.  Cardiovascular: Normal rate.  Pulmonary/Chest: Effort normal.  Abdominal: She exhibits no distension.  Musculoskeletal: Normal range of motion.  Neurological: She is alert and oriented to person, place, and time. No cranial nerve deficit. Gait normal.  Skin: Skin is warm and dry. She is not diaphoretic.  Psychiatric: She has a normal mood and affect. Her behavior is normal. Judgment and thought content normal.  Vitals reviewed.   Lab Results  Component Value Date   HGBA1C 6.2 (A) 07/06/2018    Lab Results  Component Value Date   WBC 5.3 12/12/2014   HGB 12.4 12/12/2014   HCT 37.3 12/12/2014   MCV 78.4 12/12/2014   PLT 286 12/12/2014    Lab Results  Component Value Date   CREATININE 0.68 01/06/2016   BUN 8 01/06/2016   NA 139 01/06/2016   K 3.9 01/06/2016   CL 104 01/06/2016   CO2 28 01/06/2016  Lab Results  Component Value Date   ALT 48 (H) 01/06/2016   AST 52 (H) 01/06/2016   ALKPHOS 63 01/06/2016   BILITOT 0.6 01/06/2016    Lab Results  Component Value Date   TSH 1.273 12/12/2014    Lab Results  Component Value Date   CHOL 170 12/12/2014   HDL 47 12/12/2014   LDLCALC 94 12/12/2014   TRIG 145 12/12/2014   CHOLHDL 3.6 12/12/2014     ASSESSMENT AND PLAN:  Holly Frost was seen today for depression and medication refill.  Diagnoses and all orders for this visit:  Major depressive disorder with single episode,  in remission [facility]): Well controlled.  -     FLUoxetine (PROZAC) 40 MG capsule; Take 1 capsule (40 mg total) by mouth daily.  Chronic GERD Comments: Well controlled.   Orders: -     dexlansoprazole (DEXILANT) 60 MG capsule; Take 1 capsule (60 mg total) by mouth every morning. Reported on 01/06/2016  Screening for breast cancer -     MM Digital Screening; Future  Screening for diabetes mellitus -     MM Digital Screening; Future -     Cancel: Hemoglobin A1c -     POCT glycosylated hemoglobin (Hb A1C)  Screening for lipid disorders -     Lipid panel  Screening for thyroid disorder -     TSH  Special screening for malignant neoplasms, colon -     Cologuard    The patient is advised to call or return to clinic if she does not see an improvement in symptoms, or to seek the care of the closest emergency department if she worsens with the above plan.   Deliah Boston, MHS, PA-C [facility] 07/06/2018 9:10 AM

## 2018-07-06 NOTE — Patient Instructions (Addendum)
Please come back in about 6 months.   On another note I will be leaving the practice in the end of August 2019 and will be moving back to the [place] area.  It was a pleasure to get to know you and serve you medically.  Please continue to take your medications.  I am happy to help you transition to another provider in the practice.      IF you received an x-ray today, you will receive an invoice from [facility]. Please contact [facility] at [phone] with questions or concerns regarding your invoice.   IF you received labwork today, you will receive an invoice from [facility]. Please contact [facility] at [phone] with questions or concerns regarding your invoice.   Our billing staff will not be able to assist you with questions regarding bills from these companies.  You will be contacted with the lab results as soon as they are available. The fastest way to get your results is to activate your My Chart account. Instructions are located on the last page of this paperwork. If you have not heard from us regarding the results in 2 weeks, please contact this office.

## 2018-07-07 LAB — LIPID PANEL
CHOL/HDL RATIO: 3.8 ratio (ref 0.0–4.4)
Cholesterol, Total: 186 mg/dL (ref 100–199)
HDL: 49 mg/dL (ref >39)
LDL CALC: 107 mg/dL — AB (ref 0–99)
Triglycerides: 149 mg/dL (ref 0–149)
VLDL CHOLESTEROL CAL: 30 mg/dL (ref 5–40)

## 2018-07-07 LAB — TSH: TSH: 2.87 u[IU]/mL (ref 0.450–4.500)

## 2018-07-09 ENCOUNTER — Encounter: Payer: Self-pay | Admitting: Radiology

## 2019-02-03 ENCOUNTER — Ambulatory Visit: Payer: BC Managed Care – PPO | Admitting: Emergency Medicine

## 2019-06-29 ENCOUNTER — Telehealth: Payer: Self-pay | Admitting: Physician Assistant

## 2019-06-29 DIAGNOSIS — F325 Major depressive disorder, single episode, in full remission: Secondary | ICD-10-CM

## 2019-06-29 NOTE — Telephone Encounter (Signed)
Pt needs to have an appointment to re-establish care. Courtesy refill has been sent.

## 2019-06-30 ENCOUNTER — Telehealth: Payer: Self-pay | Admitting: Family Medicine

## 2019-06-30 NOTE — Telephone Encounter (Signed)
Called pt LVM for pt to call our office . (she needs to toc to continue to get ) FR

## 2019-07-25 ENCOUNTER — Other Ambulatory Visit: Payer: Self-pay | Admitting: Family Medicine

## 2019-07-25 DIAGNOSIS — F325 Major depressive disorder, single episode, in full remission: Secondary | ICD-10-CM

## 2019-07-25 NOTE — Telephone Encounter (Signed)
Requested medications are due for refill today?  Yes  Requested medications are on the active medication list?  Yes  Last refill - 06/29/2019 #30, 0 refills   Future visit scheduled?  No  Notes to clinic   Left patient a voicemail requesting that she call to schedule follow up appointment.  She has already received courtesy refill.   Requested Prescriptions  Pending Prescriptions Disp Refills   FLUoxetine (PROZAC) 40 MG capsule [Pharmacy Med Name: FLUOXETINE HCL 40 MG CAPSULE] 90 capsule 1    Sig: TAKE 1 CAPSULE BY MOUTH EVERY DAY     Psychiatry:  Antidepressants - SSRI Failed - 07/25/2019  2:30 PM      Failed - Valid encounter within last 6 months    Recent Outpatient Visits          1 year ago Major depressive disorder with single episode, in remission [facility])   Primary Care at [place], PA-C   2 years ago Depression, major, in remission [facility])   Primary Care at [place], PA-C   3 years ago Left knee pain   Primary Care at [place], PA-C   3 years ago Left knee pain   Primary Care at [place], PA-C   3 years ago Depression   Primary Care at [place], [place]             Passed - Completed PHQ-2 or PHQ-9 in the last 360 days.

## 2019-07-29 ENCOUNTER — Other Ambulatory Visit: Payer: Self-pay | Admitting: Physician Assistant

## 2019-07-29 ENCOUNTER — Other Ambulatory Visit: Payer: Self-pay | Admitting: Family Medicine

## 2019-07-29 DIAGNOSIS — F325 Major depressive disorder, single episode, in full remission: Secondary | ICD-10-CM

## 2019-07-29 DIAGNOSIS — K219 Gastro-esophageal reflux disease without esophagitis: Secondary | ICD-10-CM

## 2019-07-29 NOTE — Telephone Encounter (Signed)
Requested Prescriptions  Pending Prescriptions Disp Refills  . DEXILANT 60 MG capsule [Pharmacy Med Name: [place] DR 60 MG CAPSULE] 30 capsule 0    Sig: TAKE 1 CAPSULE (60 MG TOTAL) BY MOUTH EVERY MORNING. REPORTED ON 01/06/2016     Gastroenterology: Proton Pump Inhibitors Failed - 07/29/2019  9:33 AM      Failed - Valid encounter within last 12 months    Recent Outpatient Visits          1 year ago Major depressive disorder with single episode, in remission [facility])   Primary Care at [place], PA-C   2 years ago Depression, major, in remission [facility])   Primary Care at [place], PA-C   3 years ago Left knee pain   Primary Care at [place], PA-C   3 years ago Left knee pain   Primary Care at [place], PA-C   3 years ago Depression   Primary Care at [place], [place]              30 day courtesy refill pt needs appt.

## 2019-08-08 ENCOUNTER — Telehealth: Payer: Self-pay | Admitting: Family Medicine

## 2019-08-08 NOTE — Telephone Encounter (Signed)
Pt is requesting a small supply of fluoxetine until her appt on 8/26. Pt states she is out of the medication. Please advise.   [id number] [place] [place] [address]  Phone: [phone] Fax: [phone]  Not a 24 hour pharmacy; exact hours not known.

## 2019-08-10 ENCOUNTER — Other Ambulatory Visit: Payer: Self-pay

## 2019-08-10 DIAGNOSIS — F325 Major depressive disorder, single episode, in full remission: Secondary | ICD-10-CM

## 2019-08-14 ENCOUNTER — Other Ambulatory Visit: Payer: Self-pay | Admitting: Family Medicine

## 2019-08-14 DIAGNOSIS — F325 Major depressive disorder, single episode, in full remission: Secondary | ICD-10-CM

## 2019-08-14 NOTE — Telephone Encounter (Signed)
Requested medication (s) are due for refill today: yes  Requested medication (s) are on the active medication list: yes  Last refill: 06/29/2019  Future visit scheduled: yes  Notes to clinic:DX Code Needed PATIENT HAS APPOINTMENT IN 5 DAYS. JUST NEEDS A WEEK'S WORTH    Requested Prescriptions  Pending Prescriptions Disp Refills   FLUoxetine (PROZAC) 40 MG capsule [Pharmacy Med Name: FLUOXETINE HCL 40 MG CAPSULE] 30 capsule 0    Sig: TAKE 1 CAPSULE BY MOUTH EVERY DAY     Psychiatry:  Antidepressants - SSRI Failed - 08/14/2019  1:12 PM      Failed - Valid encounter within last 6 months    Recent Outpatient Visits          1 year ago Major depressive disorder with single episode, in remission [facility])   Primary Care at [place], PA-C   2 years ago Depression, major, in remission [facility])   Primary Care at [place], PA-C   3 years ago Left knee pain   Primary Care at [place], PA-C   3 years ago Left knee pain   Primary Care at [place], PA-C   3 years ago Depression   Primary Care at [facility] [place], [place]      Future Appointments            In 2 days Maximiano Coss, NP Primary Care at [place], [place] - Completed PHQ-2 or PHQ-9 in the last 360 days.

## 2019-08-16 ENCOUNTER — Encounter: Payer: Self-pay | Admitting: Registered Nurse

## 2019-08-16 ENCOUNTER — Other Ambulatory Visit: Payer: Self-pay

## 2019-08-16 ENCOUNTER — Ambulatory Visit: Payer: BC Managed Care – PPO | Admitting: Registered Nurse

## 2019-08-16 VITALS — BP 117/76 | HR 71 | Temp 98.8°F | Resp 16 | Ht 62.01 in | Wt 191.0 lb

## 2019-08-16 DIAGNOSIS — K219 Gastro-esophageal reflux disease without esophagitis: Secondary | ICD-10-CM | POA: Diagnosis not present

## 2019-08-16 DIAGNOSIS — F325 Major depressive disorder, single episode, in full remission: Secondary | ICD-10-CM | POA: Diagnosis not present

## 2019-08-16 DIAGNOSIS — Z1329 Encounter for screening for other suspected endocrine disorder: Secondary | ICD-10-CM

## 2019-08-16 DIAGNOSIS — M79641 Pain in right hand: Secondary | ICD-10-CM

## 2019-08-16 DIAGNOSIS — Z23 Encounter for immunization: Secondary | ICD-10-CM

## 2019-08-16 DIAGNOSIS — Z13228 Encounter for screening for other metabolic disorders: Secondary | ICD-10-CM

## 2019-08-16 DIAGNOSIS — Z13220 Encounter for screening for lipoid disorders: Secondary | ICD-10-CM

## 2019-08-16 DIAGNOSIS — M79642 Pain in left hand: Secondary | ICD-10-CM

## 2019-08-16 DIAGNOSIS — Z1231 Encounter for screening mammogram for malignant neoplasm of breast: Secondary | ICD-10-CM

## 2019-08-16 DIAGNOSIS — Z130 Encounter for screening for diseases of the blood and blood-forming organs and certain disorders involving the immune mechanism: Secondary | ICD-10-CM

## 2019-08-16 MED ORDER — FLUOXETINE HCL 40 MG PO CAPS: ORAL_CAPSULE | ORAL | 3 refills | Status: DC

## 2019-08-16 MED ORDER — MELOXICAM 7.5 MG PO TABS: 7.5000 mg | ORAL_TABLET | Freq: Every day | ORAL | 0 refills | Status: DC

## 2019-08-16 MED ORDER — DEXILANT 60 MG PO CPDR: DELAYED_RELEASE_CAPSULE | ORAL | 3 refills | Status: DC

## 2019-08-16 NOTE — Patient Instructions (Signed)
° ° ° °  If you have lab work done today you will be contacted with your lab results within the next 2 weeks.  If you have not heard from us then please contact us. The fastest way to get your results is to register for My Chart. ° ° °IF you received an x-ray today, you will receive an invoice from [facility]. Please contact [facility] at [phone] with questions or concerns regarding your invoice.  ° °IF you received labwork today, you will receive an invoice from [facility]. Please contact [facility] at [phone] with questions or concerns regarding your invoice.  ° °Our billing staff will not be able to assist you with questions regarding bills from these companies. ° °You will be contacted with the lab results as soon as they are available. The fastest way to get your results is to activate your My Chart account. Instructions are located on the last page of this paperwork. If you have not heard from us regarding the results in 2 weeks, please contact this office. °  ° ° ° °

## 2019-08-16 NOTE — Progress Notes (Signed)
Established Patient Office Visit  Subjective:  Patient ID: Holly Frost, female    DOB: Dec 13, 1961  Age: 58 y.o. MRN: [id number]  CC:  Chief Complaint  Patient presents with  . Establish Care    need pcp to manage medications     HPI Holly Frost presents for visit to establish care. Overall, she is very healthy and has few complaints. Has a history of depression for which she takes fluoxetine with great effect. Has a history of GERD for which she has seen GI and is now managed by dexlansopranzole with good effect. No complaints regarding either medication and hopes to maintain both.   Otherwise, some bilateral hand pain today and notes some nodules forming at small joints in fingers and knuckles - she endorses early morning soreness and working with her hands frequently.  No family history of autoimmunity that we are aware of.  Past Medical History:  Diagnosis Date  . Allergy   . Anxiety   . Arthritis   . Depression   . GERD (gastroesophageal reflux disease)     Past Surgical History:  Procedure Laterality Date  . CHOLECYSTECTOMY  1989    Family History  Problem Relation Age of Onset  . Heart disease Father   . Hypertension Father   . Dementia Father     Social History   Socioeconomic History  . Marital status: Single    Spouse name: Not on file  . Number of children: Not on file  . Years of education: Not on file  . Highest education level: Not on file  Occupational History  . Occupation: Administrator, sports  Social Needs  . Financial resource strain: Not on file  . Food insecurity    Worry: Not on file    Inability: Not on file  . Transportation needs    Medical: Not on file    Non-medical: Not on file  Tobacco Use  . Smoking status: Never Smoker  . Smokeless tobacco: Never Used  Substance and Sexual Activity  . Alcohol use: Yes    Alcohol/week: 2.0 standard drinks    Types: 1 Shots of liquor, 1 Standard drinks or equivalent per week   Comment: Rarely socially  . Drug use: No  . Sexual activity: Not Currently    Comment: Once every 3 months with husband  Lifestyle  . Physical activity    Days per week: Not on file    Minutes per session: Not on file  . Stress: Not on file  Relationships  . Social Herbalist on phone: Not on file    Gets together: Not on file    Attends religious service: Not on file    Active member of club or organization: Not on file    Attends meetings of clubs or organizations: Not on file    Relationship status: Not on file  . Intimate partner violence    Fear of current or ex partner: Not on file    Emotionally abused: Not on file    Physically abused: Not on file    Forced sexual activity: Not on file  Other Topics Concern  . Not on file  Social History Narrative   Married for four years. Married previously for 21 years. 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     Outpatient Medications Prior to Visit  Medication Sig Dispense Refill  . DEXILANT 60 MG capsule TAKE 1 CAPSULE (60 MG TOTAL) BY MOUTH EVERY MORNING.  REPORTED ON 01/06/2016 30 capsule 0  . FLUoxetine (PROZAC) 40 MG capsule TAKE 1 CAPSULE BY MOUTH EVERY DAY 30 capsule 0   No facility-administered medications prior to visit.     No Known Allergies  ROS Review of Systems  Constitutional: Negative.   HENT: Negative.   Eyes: Negative.   Respiratory: Negative.   Cardiovascular: Negative.   Gastrointestinal: Negative.   Endocrine: Negative.   Genitourinary: Negative.   Musculoskeletal: Positive for arthralgias (small joints, hands) and joint swelling (knuckles, phalanges).  Skin: Negative.   Allergic/Immunologic: Negative.   Neurological: Negative.   Hematological: Negative.   Psychiatric/Behavioral: Negative.   All other systems reviewed and are negative.     Objective:    Physical Exam  Constitutional: She is oriented to person, place, and time. She appears  well-developed and well-nourished. No distress.  Cardiovascular: Normal rate and regular rhythm.  Pulmonary/Chest: Effort normal. No respiratory distress.  Neurological: She is alert and oriented to person, place, and time.  Skin: Skin is warm and dry. No rash noted. She is not diaphoretic. There is erythema (hands/fingers). No pallor.  Psychiatric: She has a normal mood and affect. Her behavior is normal. Judgment and thought content normal.  Nursing note and vitals reviewed.   BP 117/76   Pulse 71   Temp 98.8 F (37.1 C) (Oral)   Resp 16   Ht 5' 2.01" (1.575 m)   Wt 191 lb (86.6 kg)   SpO2 95%   BMI 34.93 kg/m  Wt Readings from Last 3 Encounters:  08/16/19 191 lb (86.6 kg)  07/06/18 190 lb 6.4 oz (86.4 kg)  04/17/17 188 lb 8 oz (85.5 kg)     Health Maintenance Due  Topic Date Due  . COLONOSCOPY  04/23/2012  . MAMMOGRAM  06/30/2015  . PAP SMEAR-Modifier  07/19/2016    There are no preventive care reminders to display for this patient.  Lab Results  Component Value Date   TSH 2.870 07/06/2018   Lab Results  Component Value Date   WBC 5.3 12/12/2014   HGB 12.4 12/12/2014   HCT 37.3 12/12/2014   MCV 78.4 12/12/2014   PLT 286 12/12/2014   Lab Results  Component Value Date   NA 139 01/06/2016   K 3.9 01/06/2016   CO2 28 01/06/2016   GLUCOSE 77 01/06/2016   BUN 8 01/06/2016   CREATININE 0.68 01/06/2016   BILITOT 0.6 01/06/2016   ALKPHOS 63 01/06/2016   AST 52 (H) 01/06/2016   ALT 48 (H) 01/06/2016   PROT 6.7 01/06/2016   ALBUMIN 4.0 01/06/2016   CALCIUM 9.0 01/06/2016   Lab Results  Component Value Date   CHOL 186 07/06/2018   Lab Results  Component Value Date   HDL 49 07/06/2018   Lab Results  Component Value Date   LDLCALC 107 (H) 07/06/2018   Lab Results  Component Value Date   TRIG 149 07/06/2018   Lab Results  Component Value Date   CHOLHDL 3.8 07/06/2018   Lab Results  Component Value Date   HGBA1C 6.2 (A) 07/06/2018       Assessment & Plan:   Problem List Items Addressed This Visit      Other   Depression   Relevant Medications   FLUoxetine (PROZAC) 40 MG capsule   Bilateral hand pain   Relevant Orders   ANA   Rheumatoid factor    Other Visit Diagnoses    Screening for endocrine, metabolic and immunity disorder    -  Primary   Relevant Orders   CBC with Differential/Platelet   Comprehensive metabolic panel   Hemoglobin A1c   TSH   Lipid screening       Relevant Orders   Lipid panel   Screening mammogram, encounter for       Relevant Orders   MM DIGITAL SCREENING BILATERAL   Flu vaccine need       Relevant Orders   Flu Vaccine QUAD 36+ mos IM (Completed)   Chronic GERD       Well controlled.     Relevant Medications   dexlansoprazole (DEXILANT) 60 MG capsule      Meds ordered this encounter  Medications  . dexlansoprazole (DEXILANT) 60 MG capsule    Sig: TAKE 1 CAPSULE (60 MG TOTAL) BY MOUTH EVERY MORNING. REPORTED ON 01/06/2016    Dispense:  90 capsule    Refill:  3    Order Specific Question:   Supervising Provider    Answer:   Collie Siad A [id number]  . FLUoxetine (PROZAC) 40 MG capsule    Sig: TAKE 1 CAPSULE BY MOUTH EVERY DAY    Dispense:  90 capsule    Refill:  3    Order Specific Question:   Supervising Provider    Answer:   Doristine Bosworth [id number]    Follow-up: No follow-ups on file.   PLAN  Labs drawn, including ANA and RF. Will follow up as warranted  Medications refilled  Mobic for pain with suspected arthritis  Patient encouraged to call clinic with any questions, comments, or concerns.   Janeece Agee, NP

## 2019-08-17 LAB — COMPREHENSIVE METABOLIC PANEL
ALT: 45 IU/L — ABNORMAL HIGH (ref 0–32)
AST: 60 IU/L — ABNORMAL HIGH (ref 0–40)
Albumin/Globulin Ratio: 1.7 (ref 1.2–2.2)
Albumin: 4.2 g/dL (ref 3.8–4.9)
Alkaline Phosphatase: 84 IU/L (ref 39–117)
BUN/Creatinine Ratio: 12 (ref 9–23)
BUN: 11 mg/dL (ref 6–24)
Bilirubin Total: 0.5 mg/dL (ref 0.0–1.2)
CO2: 22 mmol/L (ref 20–29)
Calcium: 9.5 mg/dL (ref 8.7–10.2)
Chloride: 99 mmol/L (ref 96–106)
Creatinine, Ser: 0.89 mg/dL (ref 0.57–1.00)
GFR calc Af Amer: 83 mL/min/{1.73_m2} (ref >59)
GFR calc non Af Amer: 72 mL/min/{1.73_m2} (ref >59)
Globulin, Total: 2.5 g/dL (ref 1.5–4.5)
Glucose: 110 mg/dL — ABNORMAL HIGH (ref 65–99)
Potassium: 4.5 mmol/L (ref 3.5–5.2)
Sodium: 139 mmol/L (ref 134–144)
Total Protein: 6.7 g/dL (ref 6.0–8.5)

## 2019-08-17 LAB — CBC WITH DIFFERENTIAL/PLATELET
Basophils Absolute: 0 10*3/uL (ref 0.0–0.2)
Basos: 1 %
EOS (ABSOLUTE): 0.4 10*3/uL (ref 0.0–0.4)
Eos: 7 %
Hematocrit: 38.8 % (ref 34.0–46.6)
Hemoglobin: 12.5 g/dL (ref 11.1–15.9)
Immature Grans (Abs): 0 10*3/uL (ref 0.0–0.1)
Immature Granulocytes: 1 %
Lymphocytes Absolute: 2.7 10*3/uL (ref 0.7–3.1)
Lymphs: 39 %
MCH: 26.4 pg — ABNORMAL LOW (ref 26.6–33.0)
MCHC: 32.2 g/dL (ref 31.5–35.7)
MCV: 82 fL (ref 79–97)
Monocytes Absolute: 0.7 10*3/uL (ref 0.1–0.9)
Monocytes: 11 %
Neutrophils Absolute: 2.6 10*3/uL (ref 1.4–7.0)
Neutrophils: 41 %
Platelets: 271 10*3/uL (ref 150–450)
RBC: 4.73 x10E6/uL (ref 3.77–5.28)
RDW: 14.7 % (ref 11.7–15.4)
WBC: 6.5 10*3/uL (ref 3.4–10.8)

## 2019-08-17 LAB — ANA: Anti Nuclear Antibody (ANA): NEGATIVE

## 2019-08-17 LAB — LIPID PANEL
Chol/HDL Ratio: 4.5 ratio — ABNORMAL HIGH (ref 0.0–4.4)
Cholesterol, Total: 190 mg/dL (ref 100–199)
HDL: 42 mg/dL (ref >39)
LDL Calculated: 101 mg/dL — ABNORMAL HIGH (ref 0–99)
Triglycerides: 235 mg/dL — ABNORMAL HIGH (ref 0–149)
VLDL Cholesterol Cal: 47 mg/dL — ABNORMAL HIGH (ref 5–40)

## 2019-08-17 LAB — HEMOGLOBIN A1C
Est. average glucose Bld gHb Est-mCnc: 126 mg/dL
Hgb A1c MFr Bld: 6 % — ABNORMAL HIGH (ref 4.8–5.6)

## 2019-08-17 LAB — TSH: TSH: 1.08 u[IU]/mL (ref 0.450–4.500)

## 2019-08-17 LAB — RHEUMATOID FACTOR: Rheumatoid fact SerPl-aCnc: <10 IU/mL (ref 0.0–13.9)

## 2019-08-18 ENCOUNTER — Encounter: Payer: Self-pay | Admitting: Registered Nurse

## 2019-08-18 NOTE — Progress Notes (Signed)
Results letter sent to patient  Rich Jhonnie Aliano, NP 

## 2019-08-30 ENCOUNTER — Encounter: Payer: Self-pay | Admitting: Registered Nurse

## 2019-10-17 ENCOUNTER — Ambulatory Visit: Payer: Self-pay

## 2020-02-17 ENCOUNTER — Ambulatory Visit: Payer: BC Managed Care – PPO | Attending: Internal Medicine

## 2020-02-17 DIAGNOSIS — Z23 Encounter for immunization: Secondary | ICD-10-CM | POA: Insufficient documentation

## 2020-02-17 NOTE — Progress Notes (Signed)
   Covid-19 Vaccination Clinic  Name:  Holly Frost    MRN: [id number] DOB: 05/17/1961  02/17/2020  [name] was observed post Covid-19 immunization for 30 minutes based on pre-vaccination screening without incidence. She was provided with Vaccine Information Sheet and instruction to access the V-Safe system.   [name] was instructed to call 911 with any severe reactions post vaccine: Marland Kitchen Difficulty breathing  . Swelling of your face and throat  . A fast heartbeat  . A bad rash all over your body  . Dizziness and weakness    Immunizations Administered    Name Date Dose VIS Date Route   Pfizer COVID-19 Vaccine 02/17/2020  4:25 PM 0.3 mL 12/01/2019 Intramuscular   Manufacturer: [employer], [employer]   Lot: [id number]   NDC: [address]-4

## 2020-02-21 ENCOUNTER — Other Ambulatory Visit: Payer: Self-pay | Admitting: Registered Nurse

## 2020-02-21 DIAGNOSIS — K219 Gastro-esophageal reflux disease without esophagitis: Secondary | ICD-10-CM

## 2020-02-21 MED ORDER — ESOMEPRAZOLE MAGNESIUM 40 MG PO CPDR: 40.0000 mg | DELAYED_RELEASE_CAPSULE | Freq: Every day | ORAL | 3 refills | Status: DC

## 2020-03-05 ENCOUNTER — Ambulatory Visit: Payer: BC Managed Care – PPO | Admitting: Registered Nurse

## 2020-03-05 ENCOUNTER — Encounter: Payer: Self-pay | Admitting: Registered Nurse

## 2020-03-05 ENCOUNTER — Other Ambulatory Visit: Payer: Self-pay

## 2020-03-05 VITALS — BP 122/80 | HR 81 | Temp 98.0°F | Ht 62.0 in | Wt 193.2 lb

## 2020-03-05 DIAGNOSIS — Z6835 Body mass index (BMI) 35.0-35.9, adult: Secondary | ICD-10-CM | POA: Diagnosis not present

## 2020-03-05 DIAGNOSIS — G2581 Restless legs syndrome: Secondary | ICD-10-CM

## 2020-03-05 MED ORDER — ALPRAZOLAM 0.5 MG PO TBDP: 0.5000 mg | ORAL_TABLET | Freq: Every day | ORAL | 0 refills | Status: DC | PRN

## 2020-03-05 NOTE — Patient Instructions (Signed)
° ° ° °  If you have lab work done today you will be contacted with your lab results within the next 2 weeks.  If you have not heard from us then please contact us. The fastest way to get your results is to register for My Chart. ° ° °IF you received an x-ray today, you will receive an invoice from [facility]. Please contact [facility] at [phone] with questions or concerns regarding your invoice.  ° °IF you received labwork today, you will receive an invoice from [facility]. Please contact [facility] at [phone] with questions or concerns regarding your invoice.  ° °Our billing staff will not be able to assist you with questions regarding bills from these companies. ° °You will be contacted with the lab results as soon as they are available. The fastest way to get your results is to activate your My Chart account. Instructions are located on the last page of this paperwork. If you have not heard from us regarding the results in 2 weeks, please contact this office. °  ° ° ° °

## 2020-03-05 NOTE — Progress Notes (Signed)
Acute Office Visit  Subjective:    Patient ID: Holly Frost, female    DOB: 01-17-1961, 59 y.o.   MRN: [id number]  Chief Complaint  Patient presents with  . Leg Pain    patient states she is traveling and needs to discuss restless leg syndrome and also to discuss some labs    HPI Patient is in today for restless legs  Reports that this has been an ongoing issue for her - she usually takes OTC supplements which help when she is at home and at work, but she has upcoming travel abroad and she is very concerned. She states that the last few trips on a plane she has taken she has been anxious, restless, and that short walks in the aisle do not help. Interested in a temporary therapy to help her out.  Also notes that she has been trying to lose weight consistently for over a year without any luck - she endorses good diet, regular exercise, and avoiding unhealthy choices. Interested in taking further steps to address this  Past Medical History:  Diagnosis Date  . Allergy   . Anxiety   . Arthritis   . Depression   . GERD (gastroesophageal reflux disease)     Past Surgical History:  Procedure Laterality Date  . CHOLECYSTECTOMY  1989    Family History  Problem Relation Age of Onset  . Heart disease Father   . Hypertension Father   . Dementia Father     Social History   Socioeconomic History  . Marital status: Single    Spouse name: Not on file  . Number of children: Not on file  . Years of education: Not on file  . Highest education level: Not on file  Occupational History  . Occupation: Research officer, trade union  Tobacco Use  . Smoking status: Never Smoker  . Smokeless tobacco: Never Used  Substance and Sexual Activity  . Alcohol use: Yes    Alcohol/week: 2.0 standard drinks    Types: 1 Shots of liquor, 1 Standard drinks or equivalent per week    Comment: Rarely socially  . Drug use: No  . Sexual activity: Not Currently    Comment: Once every 3 months with husband    Other Topics Concern  . Not on file  Social History Narrative   Married for four years. Married previously for 21 years. 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    Social Determinants of Health   Financial Resource Strain:   . Difficulty of Paying Living Expenses:   Food Insecurity:   . Worried About Programme researcher, broadcasting/film/video in the Last Year:   . Barista in the Last Year:   Transportation Needs:   . Freight forwarder (Medical):   Marland Kitchen Lack of Transportation (Non-Medical):   Physical Activity:   . Days of Exercise per Week:   . Minutes of Exercise per Session:   Stress:   . Feeling of Stress :   Social Connections:   . Frequency of Communication with Friends and Family:   . Frequency of Social Gatherings with Friends and Family:   . Attends Religious Services:   . Active Member of Clubs or Organizations:   . Attends Banker Meetings:   Marland Kitchen Marital Status:   Intimate Partner Violence:   . Fear of Current or Ex-Partner:   . Emotionally Abused:   Marland Kitchen Physically Abused:   . Sexually Abused:     Outpatient Medications  Prior to Visit  Medication Sig Dispense Refill  . Dexlansoprazole (DEXILANT) 30 MG capsule Take 40 mg by mouth daily.    Marland Kitchen esomeprazole (NEXIUM) 40 MG capsule Take 1 capsule (40 mg total) by mouth daily. 90 capsule 3  . FLUoxetine (PROZAC) 40 MG capsule TAKE 1 CAPSULE BY MOUTH EVERY DAY 90 capsule 3  . meloxicam (MOBIC) 7.5 MG tablet Take 1 tablet (7.5 mg total) by mouth daily. (Patient not taking: Reported on 03/05/2020) 30 tablet 0   No facility-administered medications prior to visit.    No Known Allergies  Review of Systems  Constitutional: Negative.   HENT: Negative.   Eyes: Negative.   Respiratory: Negative.   Cardiovascular: Negative.   Gastrointestinal: Negative.   Endocrine: Negative.   Genitourinary: Negative.   Musculoskeletal: Negative.   Skin: Negative.   Allergic/Immunologic: Negative.    Neurological: Negative.   Hematological: Negative.   Psychiatric/Behavioral: Negative.   All other systems reviewed and are negative.      Objective:    Physical Exam Vitals and nursing note reviewed.  Constitutional:      General: She is not in acute distress.    Appearance: Normal appearance. She is obese. She is not ill-appearing, toxic-appearing or diaphoretic.  HENT:     Head: Normocephalic and atraumatic.  Cardiovascular:     Rate and Rhythm: Normal rate and regular rhythm.  Pulmonary:     Effort: Pulmonary effort is normal. No respiratory distress.  Skin:    General: Skin is warm and dry.     Capillary Refill: Capillary refill takes less than 2 seconds.     Coloration: Skin is not jaundiced or pale.     Findings: No bruising, erythema, lesion or rash.  Neurological:     General: No focal deficit present.     Mental Status: She is alert and oriented to person, place, and time. Mental status is at baseline.  Psychiatric:        Mood and Affect: Mood normal.        Behavior: Behavior normal.        Thought Content: Thought content normal.        Judgment: Judgment normal.     BP 122/80   Pulse 81   Temp 98 F (36.7 C) (Temporal)   Ht 5\' 2"  (1.575 m)   Wt 193 lb 3.2 oz (87.6 kg)   SpO2 97%   BMI 35.34 kg/m  Wt Readings from Last 3 Encounters:  03/05/20 193 lb 3.2 oz (87.6 kg)  08/16/19 191 lb (86.6 kg)  07/06/18 190 lb 6.4 oz (86.4 kg)    Health Maintenance Due  Topic Date Due  . COLONOSCOPY  Never done  . MAMMOGRAM  06/30/2015  . PAP SMEAR-Modifier  07/19/2016    There are no preventive care reminders to display for this patient.   Lab Results  Component Value Date   TSH 1.080 08/16/2019   Lab Results  Component Value Date   WBC 6.5 08/16/2019   HGB 12.5 08/16/2019   HCT 38.8 08/16/2019   MCV 82 08/16/2019   PLT 271 08/16/2019   Lab Results  Component Value Date   NA 139 08/16/2019   K 4.5 08/16/2019   CO2 22 08/16/2019   GLUCOSE 110  (H) 08/16/2019   BUN 11 08/16/2019   CREATININE 0.89 08/16/2019   BILITOT 0.5 08/16/2019   ALKPHOS 84 08/16/2019   AST 60 (H) 08/16/2019   ALT 45 (H) 08/16/2019   PROT 6.7  08/16/2019   ALBUMIN 4.2 08/16/2019   CALCIUM 9.5 08/16/2019   Lab Results  Component Value Date   CHOL 190 08/16/2019   Lab Results  Component Value Date   HDL 42 08/16/2019   Lab Results  Component Value Date   LDLCALC 101 (H) 08/16/2019   Lab Results  Component Value Date   TRIG 235 (H) 08/16/2019   Lab Results  Component Value Date   CHOLHDL 4.5 (H) 08/16/2019   Lab Results  Component Value Date   HGBA1C 6.0 (H) 08/16/2019       Assessment & Plan:   Problem List Items Addressed This Visit    None    Visit Diagnoses    Restless legs    -  Primary   Relevant Medications   ALPRAZolam (NIRAVAM) 0.5 MG dissolvable tablet   Other Relevant Orders   CBC   Comprehensive metabolic panel   Magnesium   Iron, TIBC and Ferritin Panel   Hemoglobin A1c       Meds ordered this encounter  Medications  . ALPRAZolam (NIRAVAM) 0.5 MG dissolvable tablet    Sig: Take 1 tablet (0.5 mg total) by mouth daily as needed (for restless legs).    Dispense:  10 tablet    Refill:  0    Order Specific Question:   Supervising Provider    Answer:   Forrest Moron [id number]   PLAN  Labs drawn to determine etiology of restless legs  Alprazolam 0.5mg  sublingual qd prn for restless legs. Take this 20 minutes before boarding the plane.  Refer to medical weight management  Patient encouraged to call clinic with any questions, comments, or concerns.   Maximiano Coss, NP

## 2020-03-06 LAB — COMPREHENSIVE METABOLIC PANEL
ALT: 30 IU/L (ref 0–32)
AST: 38 IU/L (ref 0–40)
Albumin/Globulin Ratio: 1.5 (ref 1.2–2.2)
Albumin: 4.4 g/dL (ref 3.8–4.9)
Alkaline Phosphatase: 86 IU/L (ref 39–117)
BUN/Creatinine Ratio: 16 (ref 9–23)
BUN: 12 mg/dL (ref 6–24)
Bilirubin Total: 0.4 mg/dL (ref 0.0–1.2)
CO2: 23 mmol/L (ref 20–29)
Calcium: 9.3 mg/dL (ref 8.7–10.2)
Chloride: 103 mmol/L (ref 96–106)
Creatinine, Ser: 0.77 mg/dL (ref 0.57–1.00)
GFR calc Af Amer: 99 mL/min/{1.73_m2} (ref >59)
GFR calc non Af Amer: 86 mL/min/{1.73_m2} (ref >59)
Globulin, Total: 2.9 g/dL (ref 1.5–4.5)
Glucose: 91 mg/dL (ref 65–99)
Potassium: 4.3 mmol/L (ref 3.5–5.2)
Sodium: 138 mmol/L (ref 134–144)
Total Protein: 7.3 g/dL (ref 6.0–8.5)

## 2020-03-06 LAB — IRON,TIBC AND FERRITIN PANEL
Ferritin: 12 ng/mL — ABNORMAL LOW (ref 15–150)
Iron Saturation: 8 % — CL (ref 15–55)
Iron: 40 ug/dL (ref 27–159)
Total Iron Binding Capacity: 476 ug/dL — ABNORMAL HIGH (ref 250–450)
UIBC: 436 ug/dL — ABNORMAL HIGH (ref 131–425)

## 2020-03-06 LAB — CBC
Hematocrit: 37.8 % (ref 34.0–46.6)
Hemoglobin: 12.7 g/dL (ref 11.1–15.9)
MCH: 27.4 pg (ref 26.6–33.0)
MCHC: 33.6 g/dL (ref 31.5–35.7)
MCV: 82 fL (ref 79–97)
Platelets: 279 10*3/uL (ref 150–450)
RBC: 4.64 x10E6/uL (ref 3.77–5.28)
RDW: 14.7 % (ref 11.7–15.4)
WBC: 7.1 10*3/uL (ref 3.4–10.8)

## 2020-03-06 LAB — HEMOGLOBIN A1C
Est. average glucose Bld gHb Est-mCnc: 128 mg/dL
Hgb A1c MFr Bld: 6.1 % — ABNORMAL HIGH (ref 4.8–5.6)

## 2020-03-06 LAB — MAGNESIUM: Magnesium: 1.9 mg/dL (ref 1.6–2.3)

## 2020-03-08 NOTE — Progress Notes (Signed)
Good evening,  If we could give Holly Frost a call to let her know I definitely want her to start taking an iron supplement, that would be great. Any OTC iron supplement would be fine. We can recheck her numbers in about 3 months if she wouldn't mind booking an appt and a lab only visit 2-3 days prior.  Thank you, have a splendid weekend,  Jari Sportsman, NP

## 2020-03-09 ENCOUNTER — Ambulatory Visit: Payer: BC Managed Care – PPO | Attending: Internal Medicine

## 2020-03-09 ENCOUNTER — Other Ambulatory Visit: Payer: Self-pay

## 2020-03-09 DIAGNOSIS — Z23 Encounter for immunization: Secondary | ICD-10-CM

## 2020-03-09 NOTE — Progress Notes (Signed)
   Covid-19 Vaccination Clinic  Name:  Holly Frost    MRN: [id number] DOB: 1961-11-30  03/09/2020  [name] was observed post Covid-19 immunization for 15 minutes without incident. She was provided with Vaccine Information Sheet and instruction to access the V-Safe system.   [name] was instructed to call 911 with any severe reactions post vaccine: Marland Kitchen Difficulty breathing  . Swelling of face and throat  . A fast heartbeat  . A bad rash all over body  . Dizziness and weakness   Immunizations Administered    Name Date Dose VIS Date Route   Pfizer COVID-19 Vaccine 03/09/2020  9:51 AM 0.3 mL 12/01/2019 Intramuscular   Manufacturer: [employer], [employer]   Lot: [id number]   NDC: [address]-1

## 2020-03-11 NOTE — Progress Notes (Signed)
Spoke with pt regarding labs. Patient understands she is to follow up with office visit and nurse visit

## 2020-03-26 ENCOUNTER — Other Ambulatory Visit: Payer: Self-pay | Admitting: Registered Nurse

## 2020-03-26 DIAGNOSIS — K219 Gastro-esophageal reflux disease without esophagitis: Secondary | ICD-10-CM

## 2020-03-26 MED ORDER — ESOMEPRAZOLE MAGNESIUM 40 MG PO CPDR: 40.0000 mg | DELAYED_RELEASE_CAPSULE | Freq: Every day | ORAL | 3 refills | Status: DC

## 2020-05-17 ENCOUNTER — Other Ambulatory Visit: Payer: Self-pay | Admitting: Registered Nurse

## 2020-05-17 DIAGNOSIS — G2581 Restless legs syndrome: Secondary | ICD-10-CM

## 2020-05-17 MED ORDER — ALPRAZOLAM 0.5 MG PO TBDP: 0.5000 mg | ORAL_TABLET | Freq: Every day | ORAL | 0 refills | Status: DC | PRN

## 2020-05-17 NOTE — Telephone Encounter (Signed)
Medication Refill - Medication:  ALPRAZolam (NIRAVAM) 0.5 MG dissolvable tablet   Has the patient contacted their pharmacy? Yes advised to call office. Patient will be traveling and would like this to help her.   Preferred Pharmacy (with phone number or street name):  [phone] [address] [address], [place] - [address] AT [place] [address] Phone:  [phone]  Fax:  [phone]     Agent: Please be advised that RX refills may take up to 3 business days. We ask that you follow-up with your pharmacy.

## 2020-05-17 NOTE — Telephone Encounter (Signed)
Patient is requesting a refill of the following medications: Requested Prescriptions   Pending Prescriptions Disp Refills   ALPRAZolam (NIRAVAM) 0.5 MG dissolvable tablet 10 tablet 0    Sig: Take 1 tablet (0.5 mg total) by mouth daily as needed (for restless legs).    Date of patient request: 05/17/2020 Last office visit: 03/05/2020 Date of last refill: 03/05/2020 Last refill amount: 10 tab Follow up time period per chart: 3 months

## 2020-05-17 NOTE — Telephone Encounter (Signed)
Requested medication (s) are due for refill today - unknown  Requested medication (s) are on the active medication list -yes  Future visit scheduled -no  Last refill: 03/05/20  Notes to clinic: Request for non delegated Rx  Requested Prescriptions  Pending Prescriptions Disp Refills   ALPRAZolam (NIRAVAM) 0.5 MG dissolvable tablet 10 tablet 0    Sig: Take 1 tablet (0.5 mg total) by mouth daily as needed (for restless legs).      Not Delegated - Psychiatry:  Anxiolytics/Hypnotics Failed - 05/17/2020 12:40 PM      Failed - This refill cannot be delegated      Failed - Urine Drug Screen completed in last 360 days.      Passed - Valid encounter within last 6 months    Recent Outpatient Visits           2 months ago Restless legs   [facility], Gerlene Burdock, NP   9 months ago Screening for endocrine, metabolic and immunity disorder   [facility], Gerlene Burdock, NP   1 year ago Major depressive disorder with single episode, in remission [facility])   [facility] at [facility], Marolyn Hammock, PA-C   3 years ago Depression, major, in remission [facility])   Primary Care at [facility], Marolyn Hammock, PA-C   4 years ago Left knee pain   [facility], Dema Severin, PA-C                  Requested Prescriptions  Pending Prescriptions Disp Refills   ALPRAZolam (NIRAVAM) 0.5 MG dissolvable tablet 10 tablet 0    Sig: Take 1 tablet (0.5 mg total) by mouth daily as needed (for restless legs).      Not Delegated - Psychiatry:  Anxiolytics/Hypnotics Failed - 05/17/2020 12:40 PM      Failed - This refill cannot be delegated      Failed - Urine Drug Screen completed in last 360 days.      Passed - Valid encounter within last 6 months    Recent Outpatient Visits           2 months ago Restless legs   [facility], Richard, NP   9 months ago Screening for endocrine, metabolic and immunity disorder   [facility], Richard, NP    1 year ago Major depressive disorder with single episode, in remission [facility])   [facility], Marolyn Hammock, PA-C   3 years ago Depression, major, in remission [facility])   [facility], Marolyn Hammock, PA-C   4 years ago Left knee pain   [facility], Dema Severin, PA-C

## 2020-06-21 ENCOUNTER — Other Ambulatory Visit: Payer: Self-pay

## 2020-06-21 NOTE — Telephone Encounter (Signed)
Dexilant 60 mg [id number] - PA Case ID: [id number] - Rx #: [id number]- has been approved [facility]

## 2020-08-09 ENCOUNTER — Other Ambulatory Visit: Payer: Self-pay | Admitting: Registered Nurse

## 2020-08-09 DIAGNOSIS — F325 Major depressive disorder, single episode, in full remission: Secondary | ICD-10-CM

## 2020-08-09 NOTE — Telephone Encounter (Signed)
[place] refill given.   Please assist with the scheduling f/u visit for pt.

## 2020-08-14 NOTE — Telephone Encounter (Signed)
Called pt and sch refill appt on 11/11/20.

## 2020-08-23 ENCOUNTER — Other Ambulatory Visit: Payer: Self-pay | Admitting: Registered Nurse

## 2020-08-23 DIAGNOSIS — K219 Gastro-esophageal reflux disease without esophagitis: Secondary | ICD-10-CM

## 2020-11-11 ENCOUNTER — Ambulatory Visit: Payer: BC Managed Care – PPO | Admitting: Registered Nurse

## 2020-11-30 ENCOUNTER — Other Ambulatory Visit: Payer: Self-pay | Admitting: Registered Nurse

## 2020-11-30 DIAGNOSIS — F325 Major depressive disorder, single episode, in full remission: Secondary | ICD-10-CM

## 2020-11-30 NOTE — Telephone Encounter (Signed)
Requested medication (s) are due for refill today: yes  Requested medication (s) are on the active medication list: yes  Last refill:  08/09/20  Future visit scheduled: no  Notes to clinic:  needs appt   Requested Prescriptions  Pending Prescriptions Disp Refills   FLUoxetine (PROZAC) 40 MG capsule [Pharmacy Med Name: FLUOXETINE HCL 40 MG CAPSULE] 90 capsule 0    Sig: TAKE 1 CAPSULE BY MOUTH EVERY DAY      Psychiatry:  Antidepressants - SSRI Failed - 11/30/2020  9:06 AM      Failed - Valid encounter within last 6 months    Recent Outpatient Visits           9 months ago Restless legs   [facility], Gerlene Burdock, NP   1 year ago Screening for endocrine, metabolic and immunity disorder   [facility], Richard, NP   2 years ago Major depressive disorder with single episode, in remission [facility])   [facility], Marolyn Hammock, PA-C   3 years ago Depression, major, in remission [facility])   [facility], Marolyn Hammock, PA-C   4 years ago Left knee pain   [facility], Dema Severin, PA-C                Passed - Completed PHQ-2 or PHQ-9 in the last 360 days

## 2020-12-03 NOTE — Telephone Encounter (Signed)
Pt needs an OV for refills. Pended 30 day supply until pt makes an OV.  Patient is requesting a refill of the following medications: Requested Prescriptions   Pending Prescriptions Disp Refills   FLUoxetine (PROZAC) 40 MG capsule [Pharmacy Med Name: FLUOXETINE HCL 40 MG CAPSULE] 30 capsule 0    Sig: TAKE 1 CAPSULE BY MOUTH EVERY DAY    Date of patient request: 12/03/20 Last office visit: 03/05/20 Date of last refill: 08/09/20 Last refill amount: 90 tab., 0 refills Follow up time period per chart: n/a

## 2020-12-05 NOTE — Telephone Encounter (Signed)
12/05/2020 - PATIENT REQUESTING A REFILL ON HER FLUOXETINE 40 mg. I HAVE SCHEDULED HER AN OFFICE VISIT WITH RICH MORROW ON Monday 12/16/2020 AT 10:10 am. PATIENT HAS BEEN GIVEN A 30 DAY COURTESY SUPPLY. I DO NOT HAVE TO ROUTE BACK TO THE CLINICAL TEAM AT THIS TIME. MBC

## 2020-12-16 ENCOUNTER — Ambulatory Visit: Payer: BC Managed Care – PPO | Admitting: Registered Nurse

## 2020-12-16 ENCOUNTER — Other Ambulatory Visit: Payer: Self-pay

## 2020-12-16 ENCOUNTER — Encounter: Payer: Self-pay | Admitting: Registered Nurse

## 2020-12-16 VITALS — BP 124/79 | HR 84 | Temp 98.5°F | Resp 18 | Ht 62.0 in | Wt 189.2 lb

## 2020-12-16 DIAGNOSIS — Z1329 Encounter for screening for other suspected endocrine disorder: Secondary | ICD-10-CM | POA: Diagnosis not present

## 2020-12-16 DIAGNOSIS — Z8639 Personal history of other endocrine, nutritional and metabolic disease: Secondary | ICD-10-CM

## 2020-12-16 DIAGNOSIS — Z13220 Encounter for screening for lipoid disorders: Secondary | ICD-10-CM

## 2020-12-16 DIAGNOSIS — F325 Major depressive disorder, single episode, in full remission: Secondary | ICD-10-CM | POA: Diagnosis not present

## 2020-12-16 DIAGNOSIS — Z130 Encounter for screening for diseases of the blood and blood-forming organs and certain disorders involving the immune mechanism: Secondary | ICD-10-CM

## 2020-12-16 DIAGNOSIS — K219 Gastro-esophageal reflux disease without esophagitis: Secondary | ICD-10-CM | POA: Diagnosis not present

## 2020-12-16 DIAGNOSIS — Z13228 Encounter for screening for other metabolic disorders: Secondary | ICD-10-CM

## 2020-12-16 MED ORDER — DEXILANT 60 MG PO CPDR: 60.0000 mg | DELAYED_RELEASE_CAPSULE | Freq: Every day | ORAL | 3 refills | Status: DC

## 2020-12-16 MED ORDER — FLUOXETINE HCL 40 MG PO CAPS: 40.0000 mg | ORAL_CAPSULE | Freq: Every day | ORAL | 3 refills | Status: DC

## 2020-12-16 NOTE — Progress Notes (Signed)
Established Patient Office Visit  Subjective:  Patient ID: Holly Frost, female    DOB: [birth year]  Age: 59 y.o. MRN: [id number]  CC:  Chief Complaint  Patient presents with  . Medication Refill    Patient states she is here for an medication refill. Per patient she has no other concerns.    HPI Holly Frost presents for refill on dexilant and prozac  GERD: dexilant manages this well. No complaints or concerns. Hopes to continue. Discussed r/b/se of this medication  Depression: prozac 40mg  PO qd. Good effect. No AEs. Feeling stable. Wants to continue. Not interested in dose change.   Past Medical History:  Diagnosis Date  . Allergy   . Anxiety   . Arthritis   . Depression   . GERD (gastroesophageal reflux disease)     Past Surgical History:  Procedure Laterality Date  . CHOLECYSTECTOMY  1989    Family History  Problem Relation Age of Onset  . Heart disease Father   . Hypertension Father   . Dementia Father     Social History   Socioeconomic History  . Marital status: Single    Spouse name: Not on file  . Number of children: Not on file  . Years of education: Not on file  . Highest education level: Not on file  Occupational History  . Occupation:  Tobacco Use  . Smoking status: Never Smoker  . Smokeless tobacco: Never Used  Substance and Sexual Activity  . Alcohol use: Yes    Alcohol/week: 2.0 standard drinks    Types: 1 Shots of liquor, 1 Standard drinks or equivalent per week    Comment: Rarely socially  . Drug use: No  . Sexual activity: Not Currently    Comment: Once every 3 months with husband  Other Topics Concern  . Not on file  Social History Narrative   Married for four years. Married previously for 21 years. 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    Social Determinants of Health   Financial Resource Strain: Not on file  Food Insecurity: Not on file  Transportation Needs: Not on  file  Physical Activity: Not on file  Stress: Not on file  Social Connections: Not on file  Intimate Partner Violence: Not on file    Outpatient Medications Prior to Visit  Medication Sig Dispense Refill  . ALPRAZolam (NIRAVAM) 0.5 MG dissolvable tablet Take 1 tablet (0.5 mg total) by mouth daily as needed (for restless legs). 10 tablet 0  . DEXILANT 60 MG capsule TAKE 1 CAPSULE (60 MG TOTAL) BY MOUTH EVERY MORNING. REPORTED ON 01/06/2016 90 capsule 3  . Dexlansoprazole (DEXILANT) 30 MG capsule Take 40 mg by mouth daily.    01/08/2016 esomeprazole (NEXIUM) 40 MG capsule Take 1 capsule (40 mg total) by mouth daily. 90 capsule 3  . FLUoxetine (PROZAC) 40 MG capsule TAKE 1 CAPSULE BY MOUTH EVERY DAY 90 capsule 3  . meloxicam (MOBIC) 7.5 MG tablet Take 1 tablet (7.5 mg total) by mouth daily. 30 tablet 0   No facility-administered medications prior to visit.    No Known Allergies  ROS Review of Systems  Constitutional: Negative.   HENT: Negative.   Eyes: Negative.   Respiratory: Negative.   Cardiovascular: Negative.   Gastrointestinal: Negative.   Genitourinary: Negative.   Musculoskeletal: Negative.   Skin: Negative.   Neurological: Negative.   Psychiatric/Behavioral: Negative.   All other systems reviewed and are negative.  Objective:    Physical Exam Vitals and nursing note reviewed.  Constitutional:      General: She is not in acute distress.    Appearance: Normal appearance. She is normal weight. She is not ill-appearing, toxic-appearing or diaphoretic.  Cardiovascular:     Rate and Rhythm: Normal rate and regular rhythm.     Heart sounds: Normal heart sounds. No murmur heard. No friction rub. No gallop.   Pulmonary:     Effort: Pulmonary effort is normal. No respiratory distress.     Breath sounds: Normal breath sounds. No stridor. No wheezing, rhonchi or rales.  Chest:     Chest wall: No tenderness.  Skin:    General: Skin is warm and dry.  Neurological:      General: No focal deficit present.     Mental Status: She is alert and oriented to person, place, and time. Mental status is at baseline.  Psychiatric:        Mood and Affect: Mood normal.        Behavior: Behavior normal.        Thought Content: Thought content normal.        Judgment: Judgment normal.     BP 124/79   Pulse 84   Temp 98.5 F (36.9 C) (Temporal)   Resp 18   Ht 5\' 2"  (1.575 m)   Wt 189 lb 3.2 oz (85.8 kg)   SpO2 100%   BMI 34.61 kg/m  Wt Readings from Last 3 Encounters:  12/16/20 189 lb 3.2 oz (85.8 kg)  03/05/20 193 lb 3.2 oz (87.6 kg)  08/16/19 191 lb (86.6 kg)     There are no preventive care reminders to display for this patient.  There are no preventive care reminders to display for this patient.  Lab Results  Component Value Date   TSH 1.080 08/16/2019   Lab Results  Component Value Date   WBC 7.1 03/05/2020   HGB 12.7 03/05/2020   HCT 37.8 03/05/2020   MCV 82 03/05/2020   PLT 279 03/05/2020   Lab Results  Component Value Date   NA 138 03/05/2020   K 4.3 03/05/2020   CO2 23 03/05/2020   GLUCOSE 91 03/05/2020   BUN 12 03/05/2020   CREATININE 0.77 03/05/2020   BILITOT 0.4 03/05/2020   ALKPHOS 86 03/05/2020   AST 38 03/05/2020   ALT 30 03/05/2020   PROT 7.3 03/05/2020   ALBUMIN 4.4 03/05/2020   CALCIUM 9.3 03/05/2020   Lab Results  Component Value Date   CHOL 190 08/16/2019   Lab Results  Component Value Date   HDL 42 08/16/2019   Lab Results  Component Value Date   LDLCALC 101 (H) 08/16/2019   Lab Results  Component Value Date   TRIG 235 (H) 08/16/2019   Lab Results  Component Value Date   CHOLHDL 4.5 (H) 08/16/2019   Lab Results  Component Value Date   HGBA1C 6.1 (H) 03/05/2020      Assessment & Plan:   Problem List Items Addressed This Visit      Other   Depression   Relevant Medications   FLUoxetine (PROZAC) 40 MG capsule    Other Visit Diagnoses    History of iron deficiency    -  Primary    Relevant Orders   CBC With Differential   Iron, TIBC and Ferritin Panel   Chronic GERD       Well controlled.     Relevant Medications   dexlansoprazole (  DEXILANT) 60 MG capsule   Screening for endocrine, metabolic and immunity disorder       Relevant Orders   Comprehensive metabolic panel   Hemoglobin A1c   TSH   Lipid screening       Relevant Orders   Lipid panel      Meds ordered this encounter  Medications  . dexlansoprazole (DEXILANT) 60 MG capsule    Sig: Take 1 capsule (60 mg total) by mouth daily.    Dispense:  90 capsule    Refill:  3    Generic ok    Order Specific Question:   Supervising Provider    Answer:   Neva Seat, JEFFREY R [[id number]]  . FLUoxetine (PROZAC) 40 MG capsule    Sig: Take 1 capsule (40 mg total) by mouth daily.    Dispense:  90 capsule    Refill:  3    Order Specific Question:   Supervising Provider    Answer:   Neva Seat, JEFFREY R [[id number]]    Follow-up: No follow-ups on file.   PLAN  Refill meds x 1 year  Labs collected. Will follow up with the patient as warranted.  Patient encouraged to call clinic with any questions, comments, or concerns.  Janeece Agee, NP

## 2020-12-16 NOTE — Patient Instructions (Signed)
° ° ° °  If you have lab work done today you will be contacted with your lab results within the next 2 weeks.  If you have not heard from us then please contact us. The fastest way to get your results is to register for My Chart. ° ° °IF you received an x-ray today, you will receive an invoice from [facility]. Please contact  Radiology at [phone] with questions or concerns regarding your invoice.  ° °IF you received labwork today, you will receive an invoice from [facility]. Please contact [facility] at [phone] with questions or concerns regarding your invoice.  ° °Our billing staff will not be able to assist you with questions regarding bills from these companies. ° °You will be contacted with the lab results as soon as they are available. The fastest way to get your results is to activate your My Chart account. Instructions are located on the last page of this paperwork. If you have not heard from us regarding the results in 2 weeks, please contact this office. °  ° ° ° °

## 2020-12-17 ENCOUNTER — Encounter: Payer: Self-pay | Admitting: Radiology

## 2020-12-17 LAB — CBC WITH DIFFERENTIAL
Basophils Absolute: 0 10*3/uL (ref 0.0–0.2)
Basos: 1 %
EOS (ABSOLUTE): 0.3 10*3/uL (ref 0.0–0.4)
Eos: 6 %
Hematocrit: 42.7 % (ref 34.0–46.6)
Hemoglobin: 14 g/dL (ref 11.1–15.9)
Immature Grans (Abs): 0 10*3/uL (ref 0.0–0.1)
Immature Granulocytes: 0 %
Lymphocytes Absolute: 2.2 10*3/uL (ref 0.7–3.1)
Lymphs: 37 %
MCH: 28.9 pg (ref 26.6–33.0)
MCHC: 32.8 g/dL (ref 31.5–35.7)
MCV: 88 fL (ref 79–97)
Monocytes Absolute: 0.5 10*3/uL (ref 0.1–0.9)
Monocytes: 8 %
Neutrophils Absolute: 2.9 10*3/uL (ref 1.4–7.0)
Neutrophils: 48 %
RBC: 4.84 x10E6/uL (ref 3.77–5.28)
RDW: 14.1 % (ref 11.7–15.4)
WBC: 6.1 10*3/uL (ref 3.4–10.8)

## 2020-12-17 LAB — COMPREHENSIVE METABOLIC PANEL
ALT: 36 IU/L — ABNORMAL HIGH (ref 0–32)
AST: 43 IU/L — ABNORMAL HIGH (ref 0–40)
Albumin/Globulin Ratio: 1.6 (ref 1.2–2.2)
Albumin: 4.3 g/dL (ref 3.8–4.9)
Alkaline Phosphatase: 81 IU/L (ref 44–121)
BUN/Creatinine Ratio: 10 (ref 9–23)
BUN: 9 mg/dL (ref 6–24)
Bilirubin Total: 0.6 mg/dL (ref 0.0–1.2)
CO2: 23 mmol/L (ref 20–29)
Calcium: 9.1 mg/dL (ref 8.7–10.2)
Chloride: 103 mmol/L (ref 96–106)
Creatinine, Ser: 0.86 mg/dL (ref 0.57–1.00)
GFR calc Af Amer: 86 mL/min/{1.73_m2} (ref >59)
GFR calc non Af Amer: 75 mL/min/{1.73_m2} (ref >59)
Globulin, Total: 2.7 g/dL (ref 1.5–4.5)
Glucose: 96 mg/dL (ref 65–99)
Potassium: 3.7 mmol/L (ref 3.5–5.2)
Sodium: 140 mmol/L (ref 134–144)
Total Protein: 7 g/dL (ref 6.0–8.5)

## 2020-12-17 LAB — HEMOGLOBIN A1C
Est. average glucose Bld gHb Est-mCnc: 126 mg/dL
Hgb A1c MFr Bld: 6 % — ABNORMAL HIGH (ref 4.8–5.6)

## 2020-12-17 LAB — IRON,TIBC AND FERRITIN PANEL
Ferritin: 13 ng/mL — ABNORMAL LOW (ref 15–150)
Iron Saturation: 16 % (ref 15–55)
Iron: 66 ug/dL (ref 27–159)
Total Iron Binding Capacity: 421 ug/dL (ref 250–450)
UIBC: 355 ug/dL (ref 131–425)

## 2020-12-17 LAB — LIPID PANEL
Chol/HDL Ratio: 4.3 ratio (ref 0.0–4.4)
Cholesterol, Total: 195 mg/dL (ref 100–199)
HDL: 45 mg/dL (ref >39)
LDL Chol Calc (NIH): 111 mg/dL — ABNORMAL HIGH (ref 0–99)
Triglycerides: 222 mg/dL — ABNORMAL HIGH (ref 0–149)
VLDL Cholesterol Cal: 39 mg/dL (ref 5–40)

## 2020-12-17 LAB — TSH: TSH: 1.19 u[IU]/mL (ref 0.450–4.500)

## 2021-02-19 ENCOUNTER — Other Ambulatory Visit: Payer: Self-pay

## 2021-02-19 ENCOUNTER — Encounter: Payer: Self-pay | Admitting: Registered Nurse

## 2021-02-19 ENCOUNTER — Telehealth: Payer: Self-pay | Admitting: Registered Nurse

## 2021-02-19 ENCOUNTER — Ambulatory Visit: Payer: BC Managed Care – PPO | Admitting: Registered Nurse

## 2021-02-19 VITALS — BP 117/65 | HR 97 | Temp 98.0°F | Resp 18 | Ht 62.0 in | Wt 190.8 lb

## 2021-02-19 DIAGNOSIS — R7989 Other specified abnormal findings of blood chemistry: Secondary | ICD-10-CM | POA: Diagnosis not present

## 2021-02-19 DIAGNOSIS — R142 Eructation: Secondary | ICD-10-CM

## 2021-02-19 DIAGNOSIS — K219 Gastro-esophageal reflux disease without esophagitis: Secondary | ICD-10-CM

## 2021-02-19 MED ORDER — SUCRALFATE 1 G PO TABS: 1.0000 g | ORAL_TABLET | Freq: Three times a day (TID) | ORAL | 1 refills | Status: AC

## 2021-02-19 NOTE — Patient Instructions (Signed)
° ° ° °  If you have lab work done today you will be contacted with your lab results within the next 2 weeks.  If you have not heard from us then please contact us. The fastest way to get your results is to register for My Chart. ° ° °IF you received an x-ray today, you will receive an invoice from [facility]. Please contact [facility] at [phone] with questions or concerns regarding your invoice.  ° °IF you received labwork today, you will receive an invoice from [facility]. Please contact [facility] at [phone] with questions or concerns regarding your invoice.  ° °Our billing staff will not be able to assist you with questions regarding bills from these companies. ° °You will be contacted with the lab results as soon as they are available. The fastest way to get your results is to activate your My Chart account. Instructions are located on the last page of this paperwork. If you have not heard from us regarding the results in 2 weeks, please contact this office. °  ° ° ° °

## 2021-02-19 NOTE — Telephone Encounter (Signed)
Patient saw provider in office today and is requesting a hard copy of the blood results be mailed to her home address of [address]. [place], [place] [address]. Please advise at [phone]

## 2021-02-19 NOTE — Telephone Encounter (Signed)
I have attempted to call to tell her that she just got her labs drawn here today and her results are not back yet. When they come back we can mail them to her home.   There was no answer so I left a message to call back.

## 2021-02-20 LAB — CBC WITH DIFFERENTIAL
Basophils Absolute: 0 10*3/uL (ref 0.0–0.2)
Basos: 0 %
EOS (ABSOLUTE): 0.4 10*3/uL (ref 0.0–0.4)
Eos: 7 %
Hematocrit: 42 % (ref 34.0–46.6)
Hemoglobin: 14.2 g/dL (ref 11.1–15.9)
Immature Grans (Abs): 0 10*3/uL (ref 0.0–0.1)
Immature Granulocytes: 0 %
Lymphocytes Absolute: 2.6 10*3/uL (ref 0.7–3.1)
Lymphs: 38 %
MCH: 30.2 pg (ref 26.6–33.0)
MCHC: 33.8 g/dL (ref 31.5–35.7)
MCV: 89 fL (ref 79–97)
Monocytes Absolute: 0.6 10*3/uL (ref 0.1–0.9)
Monocytes: 9 %
Neutrophils Absolute: 3.1 10*3/uL (ref 1.4–7.0)
Neutrophils: 46 %
RBC: 4.7 x10E6/uL (ref 3.77–5.28)
RDW: 15.6 % — ABNORMAL HIGH (ref 11.7–15.4)
WBC: 6.8 10*3/uL (ref 3.4–10.8)

## 2021-02-20 LAB — H PYLORI, IGM, IGG, IGA AB
H pylori, IgM Abs: <9 units (ref 0.0–8.9)
H. pylori, IgA Abs: <9 units (ref 0.0–8.9)
H. pylori, IgG AbS: 1.09 Index Value — ABNORMAL HIGH (ref 0.00–0.79)

## 2021-02-20 LAB — COMPREHENSIVE METABOLIC PANEL
ALT: 47 IU/L — ABNORMAL HIGH (ref 0–32)
AST: 59 IU/L — ABNORMAL HIGH (ref 0–40)
Albumin/Globulin Ratio: 1.6 (ref 1.2–2.2)
Albumin: 4.5 g/dL (ref 3.8–4.9)
Alkaline Phosphatase: 104 IU/L (ref 44–121)
BUN/Creatinine Ratio: 12 (ref 9–23)
BUN: 10 mg/dL (ref 6–24)
Bilirubin Total: 0.5 mg/dL (ref 0.0–1.2)
CO2: 23 mmol/L (ref 20–29)
Calcium: 9.7 mg/dL (ref 8.7–10.2)
Chloride: 100 mmol/L (ref 96–106)
Creatinine, Ser: 0.84 mg/dL (ref 0.57–1.00)
Globulin, Total: 2.8 g/dL (ref 1.5–4.5)
Glucose: 89 mg/dL (ref 65–99)
Potassium: 4.5 mmol/L (ref 3.5–5.2)
Sodium: 140 mmol/L (ref 134–144)
Total Protein: 7.3 g/dL (ref 6.0–8.5)
eGFR: 80 mL/min/{1.73_m2} (ref >59)

## 2021-02-20 LAB — LIPASE: Lipase: 40 U/L (ref 14–72)

## 2021-02-20 LAB — AMYLASE: Amylase: 64 U/L (ref 31–110)

## 2021-02-21 ENCOUNTER — Encounter: Payer: Self-pay | Admitting: Registered Nurse

## 2021-02-21 NOTE — Progress Notes (Signed)
Established Patient Office Visit  Subjective:  Patient ID: Holly Frost, female    DOB: 01-12-1961  Age: 60 y.o. MRN: [id number]  CC:  Chief Complaint  Patient presents with  . GI Problem    Patient states for about 3 weeks she has been having some stomach problems and pain , burping , diarrhea, and sore throat. Patient only been taking Tums and Dexilant.    HPI Holly Frost presents for GI symptoms 3 weeks of increased epigastric pain, belching, and inconsistent BM Has been continuing dexilant and has added Tums, which have not helped too much Some sore throat - feels scratchy, denies sharp pain and globus sensation No sudden weight changes No blood in urine or stool, no mucus in stool  No constipation lasting more than 1-2 days.  Does have hx of cholelithiases with cholecystectomy. Last labs showed mildly elevated LFTs  Past Medical History:  Diagnosis Date  . Allergy   . Anxiety   . Arthritis   . Depression   . GERD (gastroesophageal reflux disease)     Past Surgical History:  Procedure Laterality Date  . CHOLECYSTECTOMY  1989    Family History  Problem Relation Age of Onset  . Heart disease Father   . Hypertension Father   . Dementia Father     Social History   Socioeconomic History  . Marital status: Single    Spouse name: Not on file  . Number of children: Not on file  . Years of education: Not on file  . Highest education level: Not on file  Occupational History  . Occupation: Research officer, trade union  Tobacco Use  . Smoking status: Never Smoker  . Smokeless tobacco: Never Used  Substance and Sexual Activity  . Alcohol use: Yes    Alcohol/week: 2.0 standard drinks    Types: 1 Shots of liquor, 1 Standard drinks or equivalent per week    Comment: Rarely socially  . Drug use: No  . Sexual activity: Not Currently    Comment: Once every 3 months with husband  Other Topics Concern  . Not on file  Social History Narrative   Married for four years.  Married previously for 21 years. 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    Social Determinants of Health   Financial Resource Strain: Not on file  Food Insecurity: Not on file  Transportation Needs: Not on file  Physical Activity: Not on file  Stress: Not on file  Social Connections: Not on file  Intimate Partner Violence: Not on file    Outpatient Medications Prior to Visit  Medication Sig Dispense Refill  . dexlansoprazole (DEXILANT) 60 MG capsule Take 1 capsule (60 mg total) by mouth daily. 90 capsule 3  . FLUoxetine (PROZAC) 40 MG capsule Take 1 capsule (40 mg total) by mouth daily. 90 capsule 3   No facility-administered medications prior to visit.    No Known Allergies  ROS Review of Systems Per hpi     Objective:    Physical Exam Vitals and nursing note reviewed.  Constitutional:      General: She is not in acute distress.    Appearance: Normal appearance. She is normal weight. She is not ill-appearing, toxic-appearing or diaphoretic.  Cardiovascular:     Rate and Rhythm: Normal rate and regular rhythm.     Heart sounds: Normal heart sounds. No murmur heard. No friction rub. No gallop.   Pulmonary:     Effort: Pulmonary effort is normal. No  respiratory distress.     Breath sounds: Normal breath sounds. No stridor. No wheezing, rhonchi or rales.  Chest:     Chest wall: No tenderness.  Abdominal:     General: Abdomen is flat. Bowel sounds are normal. There is no distension.     Palpations: Abdomen is soft. There is no mass.     Tenderness: There is no abdominal tenderness.     Hernia: No hernia is present.  Skin:    General: Skin is warm and dry.  Neurological:     General: No focal deficit present.     Mental Status: She is alert and oriented to person, place, and time. Mental status is at baseline.  Psychiatric:        Mood and Affect: Mood normal.        Behavior: Behavior normal.        Thought Content: Thought  content normal.        Judgment: Judgment normal.     BP 117/65   Pulse 97   Temp 98 F (36.7 C) (Temporal)   Resp 18   Ht 5\' 2"  (1.575 m)   Wt 190 lb 12.8 oz (86.5 kg)   SpO2 98%   BMI 34.90 kg/m  Wt Readings from Last 3 Encounters:  02/19/21 190 lb 12.8 oz (86.5 kg)  12/16/20 189 lb 3.2 oz (85.8 kg)  03/05/20 193 lb 3.2 oz (87.6 kg)     Health Maintenance Due  Topic Date Due  . PAP SMEAR-Modifier  07/19/2016    There are no preventive care reminders to display for this patient.  Lab Results  Component Value Date   TSH 1.190 12/16/2020   Lab Results  Component Value Date   WBC 6.8 02/19/2021   HGB 14.2 02/19/2021   HCT 42.0 02/19/2021   MCV 89 02/19/2021   PLT 279 03/05/2020   Lab Results  Component Value Date   NA 140 02/19/2021   K 4.5 02/19/2021   CO2 23 02/19/2021   GLUCOSE 89 02/19/2021   BUN 10 02/19/2021   CREATININE 0.84 02/19/2021   BILITOT 0.5 02/19/2021   ALKPHOS 104 02/19/2021   AST 59 (H) 02/19/2021   ALT 47 (H) 02/19/2021   PROT 7.3 02/19/2021   ALBUMIN 4.5 02/19/2021   CALCIUM 9.7 02/19/2021   Lab Results  Component Value Date   CHOL 195 12/16/2020   Lab Results  Component Value Date   HDL 45 12/16/2020   Lab Results  Component Value Date   LDLCALC 111 (H) 12/16/2020   Lab Results  Component Value Date   TRIG 222 (H) 12/16/2020   Lab Results  Component Value Date   CHOLHDL 4.3 12/16/2020   Lab Results  Component Value Date   HGBA1C 6.0 (H) 12/16/2020      Assessment & Plan:   Problem List Items Addressed This Visit      Other   Elevated LFTs   Relevant Orders   CBC With Differential (Completed)   Comprehensive metabolic panel (Completed)   Amylase (Completed)   Lipase (Completed)   12/18/2020 Abdomen Limited RUQ (LIVER/GB)    Other Visit Diagnoses    Belching    -  Primary   Relevant Medications   sucralfate (CARAFATE) 1 g tablet   Other Relevant Orders   H Pylori, IGM, IGG, IGA AB (Completed)   Chronic GERD        Relevant Medications   sucralfate (CARAFATE) 1 g tablet   Other Relevant Orders  CBC With Differential (Completed)   Comprehensive metabolic panel (Completed)   Amylase (Completed)   Lipase (Completed)   US Abdomen Limited RUQ (LIVER/GB)   H Pylori, IGM, IGG, IGA AB (Completed)      Meds ordered this encounter  Medications  . sucralfate (CARAFATE) 1 g tablet    Sig: Take 1 tablet (1 g total) by mouth 4 (four) times daily -  with meals and at bedtime.    Dispense:  40 tablet    Refill:  1    Order Specific Question:   Supervising Provider    Answer:   Neva Seat, JEFFREY R [[id number]]    Follow-up: No follow-ups on file.   PLAN  Will draw h pylori serology  Labs collected. Will follow up with the patient as warranted.  US liver for elevated LFTs. Particularly important if LFTs continue elevation  Add sucralfate tid ac and qhs for next 10 days  Again reviewed lifestyle changes, of which [name] seems largely compliant with  Patient encouraged to call clinic with any questions, comments, or concerns.  Janeece Agee, NP

## 2021-03-04 ENCOUNTER — Encounter: Payer: Self-pay | Admitting: Family Medicine

## 2021-03-04 ENCOUNTER — Other Ambulatory Visit: Payer: Self-pay

## 2021-03-04 ENCOUNTER — Telehealth (INDEPENDENT_AMBULATORY_CARE_PROVIDER_SITE_OTHER): Payer: BC Managed Care – PPO | Admitting: Family Medicine

## 2021-03-04 ENCOUNTER — Ambulatory Visit: Payer: Self-pay

## 2021-03-04 DIAGNOSIS — J029 Acute pharyngitis, unspecified: Secondary | ICD-10-CM

## 2021-03-04 LAB — POCT RAPID STREP A (OFFICE): Rapid Strep A Screen: NEGATIVE

## 2021-03-04 NOTE — Addendum Note (Signed)
Addended by: Alm Bustard R on: 03/04/2021 01:36 PM   Modules accepted: Orders

## 2021-03-04 NOTE — Progress Notes (Signed)
Virtual Visit Note  I connected with patient on 03/04/21 at 1219 by telephone due to unable to work Epic video visit and verified that I am speaking with the correct person using two identifiers. Holly Frost is currently located at home and no family members are currently with them during visit. The provider, Azalee Course Ellyn Rubiano, FNP is located in their office at time of visit.  I discussed the limitations, risks, security and privacy concerns of performing an evaluation and management service by telephone and the availability of in person appointments. I also discussed with the patient that there may be a patient responsible charge related to this service. The patient expressed understanding and agreed to proceed.   I provided 20 minutes of non-face-to-face time during this encounter.  Chief Complaint  Patient presents with  . Sore Throat    Difficulty swallowing , hoarse x 5 days sees white dots on back of throat     HPI ? Sore throat since last Thursday Painful to swallow Minor cough and mucus The sore throat is worse in the morning and the evening Denies history of allergies Does has a history of GERD Takes Dexilant for that Verbalizes that is well controlled Does see white spots on the back of her throat Has been drinking lemonade and honey Using halls cough drops Taking Nyquil cold and flu at night The cough is better   No Known Allergies  Prior to Admission medications   Medication Sig Start Date End Date Taking? Authorizing Provider  dexlansoprazole (DEXILANT) 60 MG capsule Take 1 capsule (60 mg total) by mouth daily. 12/16/20  Yes Janeece Agee, NP  FLUoxetine (PROZAC) 40 MG capsule Take 1 capsule (40 mg total) by mouth daily. 12/16/20  Yes Janeece Agee, NP  sucralfate (CARAFATE) 1 g tablet Take 1 tablet (1 g total) by mouth 4 (four) times daily -  with meals and at bedtime. 02/19/21  Yes Janeece Agee, NP    Past Medical History:  Diagnosis Date  . Allergy    . Anxiety   . Arthritis   . Depression   . GERD (gastroesophageal reflux disease)     Past Surgical History:  Procedure Laterality Date  . CHOLECYSTECTOMY  1989    Social History   Tobacco Use  . Smoking status: Never Smoker  . Smokeless tobacco: Never Used  Substance Use Topics  . Alcohol use: Yes    Alcohol/week: 2.0 standard drinks    Types: 1 Shots of liquor, 1 Standard drinks or equivalent per week    Comment: Rarely socially    Family History  Problem Relation Age of Onset  . Heart disease Father   . Hypertension Father   . Dementia Father     Review of Systems  Constitutional: Negative for chills, fever and malaise/fatigue.  HENT: Positive for congestion and sore throat. Negative for ear discharge, ear pain and sinus pain.   Respiratory: Positive for cough and sputum production. Negative for hemoptysis, shortness of breath and wheezing.   Cardiovascular: Negative for chest pain and palpitations.  Gastrointestinal: Negative for heartburn.  Musculoskeletal: Negative for myalgias.  Neurological: Negative for dizziness and headaches.    Objective  Constitutional:      General: Not in acute distress.    Appearance: Normal appearance. Not ill-appearing.   Pulmonary:     Effort: Pulmonary effort is normal. No respiratory distress.  Neurological:     Mental Status: Alert and oriented to person, place, and time.  Psychiatric:  Mood and Affect: Mood normal.        Behavior: Behavior normal.    ASSESSMENT and PLAN  Problem List Items Addressed This Visit   None   Visit Diagnoses    Sore throat    -  Primary   Relevant Orders   POCT rapid strep A     Plan . Discussed conservative treatments . Will follow up with lab results . RTC/ED precautions discussed  Return if symptoms worsen or fail to improve.    The above assessment and management plan was discussed with the patient. The patient verbalized understanding of and has agreed to the  management plan. Patient is aware to call the clinic if symptoms persist or worsen. Patient is aware when to return to the clinic for a follow-up visit. Patient educated on when it is appropriate to go to the emergency department.     Macario Carls Merion Caton, FNP-BC Primary [facility] at [facility] [address] [place], [place] [address] Ph.  [phone] Fax [phone]

## 2021-03-04 NOTE — Patient Instructions (Addendum)
Sore Throat When you have a sore throat, your throat may feel:  Tender.  Burning.  Irritated.  Scratchy.  Painful when you swallow.  Painful when you talk. Many things can cause a sore throat, such as:  An infection.  Allergies.  Dry air.  Smoke or pollution.  Radiation treatment.  Gastroesophageal reflux disease (GERD).  A tumor. A sore throat can be the first sign of another sickness. It can happen with other problems, like:  Coughing.  Sneezing.  Fever.  Swelling in the neck. Most sore throats go away without treatment. Follow these instructions at home:  Take over-the-counter medicines only as told by your doctor. ? If your child has a sore throat, do not give your child aspirin.  Drink enough fluids to keep your pee (urine) pale yellow.  Rest when you feel you need to.  To help with pain: ? Sip warm liquids, such as broth, herbal tea, or warm water. ? Eat or drink cold or frozen liquids, such as frozen ice pops. ? Gargle with a salt-water mixture 3-4 times a day or as needed. To make a salt-water mixture, add -1 tsp (3-6 g) of salt to 1 cup (237 mL) of warm water. Mix it until you cannot see the salt anymore. ? Suck on hard candy or throat lozenges. ? Put a cool-mist humidifier in your bedroom at night. ? Sit in the bathroom with the door closed for 5-10 minutes while you run hot water in the shower.  Do not use any products that contain nicotine or tobacco, such as cigarettes, e-cigarettes, and chewing tobacco. If you need help quitting, ask your doctor.  Wash your hands well and often with soap and water. If soap and water are not available, use hand sanitizer.      Contact a doctor if:  You have a fever for more than 2-3 days.  You keep having symptoms for more than 2-3 days.  Your throat does not get better in 7 days.  You have a fever and your symptoms suddenly get worse.  Your child who is 3 months to 30 years old has a  temperature of 102.54F (39C) or higher. Get help right away if:  You have trouble breathing.  You cannot swallow fluids, soft foods, or your saliva.  You have swelling in your throat or neck that gets worse.  You keep feeling sick to your stomach (nauseous).  You keep throwing up (vomiting). Summary  A sore throat is pain, burning, irritation, or scratchiness in the throat. Many things can cause a sore throat.  Take over-the-counter medicines only as told by your doctor. Do not give your child aspirin.  Drink plenty of fluids, and rest as needed.  Contact a doctor if your symptoms get worse or your sore throat does not get better within 7 days. This information is not intended to replace advice given to you by your health care provider. Make sure you discuss any questions you have with your health care provider. Document Revised: 05/09/2018 Document Reviewed: 05/09/2018 Elsevier Patient Education  2021 [employer].  If you have lab work done today you will be contacted with your lab results within the next 2 weeks.  If you have not heard from [place] then please contact us. The fastest way to get your results is to register for My Chart.   IF you received an x-ray today, you will receive an invoice from [facility]. Please contact [facility] at [phone] with questions  or concerns regarding your invoice.   IF you received labwork today, you will receive an invoice from [facility]. Please contact [facility] at [phone] with questions or concerns regarding your invoice.   Our billing staff will not be able to assist you with questions regarding bills from these companies.  You will be contacted with the lab results as soon as they are available. The fastest way to get your results is to activate your My Chart account. Instructions are located on the last page of this paperwork. If you have not heard from [place] regarding the results in 2 weeks, please contact this  office.

## 2021-03-07 ENCOUNTER — Telehealth (INDEPENDENT_AMBULATORY_CARE_PROVIDER_SITE_OTHER): Payer: BC Managed Care – PPO | Admitting: Registered Nurse

## 2021-03-07 ENCOUNTER — Other Ambulatory Visit: Payer: Self-pay

## 2021-03-07 DIAGNOSIS — J302 Other seasonal allergic rhinitis: Secondary | ICD-10-CM | POA: Diagnosis not present

## 2021-03-07 MED ORDER — AZELASTINE HCL 0.1 % NA SOLN: 1.0000 | Freq: Two times a day (BID) | NASAL | 12 refills | Status: AC

## 2021-03-07 MED ORDER — CETIRIZINE HCL 10 MG PO TABS: 10.0000 mg | ORAL_TABLET | Freq: Every day | ORAL | 11 refills | Status: AC

## 2021-03-07 MED ORDER — PREDNISONE 10 MG (21) PO TBPK: ORAL_TABLET | ORAL | 0 refills | Status: AC

## 2021-03-07 NOTE — Progress Notes (Signed)
Telemedicine Encounter- SOAP NOTE Established Patient  This telephone encounter was conducted with the patient's (or proxy's) verbal consent via audio telecommunications: yes  Patient was instructed to have this encounter in a suitably private space; and to only have persons present to whom they give permission to participate. In addition, patient identity was confirmed by use of name plus two identifiers (DOB and address).  I discussed the limitations, risks, security and privacy concerns of performing an evaluation and management service by telephone and the availability of in person appointments. I also discussed with the patient that there may be a patient responsible charge related to this service. The patient expressed understanding and agreed to proceed.  I spent a total of talking with the patient or their proxy.  Patient at work in secure location Provider in office  Chief Complaint  Patient presents with  . Sore Throat    1 week, sore throat, "crusty" eyes in the morning not shut but collecting in corners, denies fever, hurts a lot swallowing     Subjective   Holly Frost is a 60 y.o. established patient. Telephone visit today for ongoing upper respiratory symptoms  HPI One week or more Crusting around eyes when waking Sore throat Ear pressure bilaterally Has tried some OTCs with no relief. Does not think she has infection, no fever, chills, fatigue, shob Thinking more reaction to med or allergy, only new med was sucralfate but she has stopped taking this.  Patient Active Problem List   Diagnosis Date Noted  . Knee pain, bilateral 12/25/2014  . Bilateral hand pain 12/25/2014  . Elevated LFTs 12/24/2014  . Depression 12/12/2014  . GERD (gastroesophageal reflux disease) 12/12/2014    Past Medical History:  Diagnosis Date  . Allergy   . Anxiety   . Arthritis   . Depression   . GERD (gastroesophageal reflux disease)     Current Outpatient  Medications  Medication Sig Dispense Refill  . azelastine (ASTELIN) 0.1 % nasal spray Place 1 spray into both nostrils 2 (two) times daily. Use in each nostril as directed 30 mL 12  . cetirizine (ZYRTEC) 10 MG tablet Take 1 tablet (10 mg total) by mouth daily. 30 tablet 11  . dexlansoprazole (DEXILANT) 60 MG capsule Take 1 capsule (60 mg total) by mouth daily. 90 capsule 3  . FLUoxetine (PROZAC) 40 MG capsule Take 1 capsule (40 mg total) by mouth daily. 90 capsule 3  . predniSONE (STERAPRED UNI-PAK 21 TAB) 10 MG (21) TBPK tablet Take per package instructions. Do not skip doses. Finish entire supply. 1 each 0  . sucralfate (CARAFATE) 1 g tablet Take 1 tablet (1 g total) by mouth 4 (four) times daily -  with meals and at bedtime. 40 tablet 1   No current facility-administered medications for this visit.    No Known Allergies  Social History   Socioeconomic History  . Marital status: Single    Spouse name: Not on file  . Number of children: Not on file  . Years of education: Not on file  . Highest education level: Not on file  Occupational History  . Occupation: Research officer, trade union  Tobacco Use  . Smoking status: Never Smoker  . Smokeless tobacco: Never Used  Substance and Sexual Activity  . Alcohol use: Yes    Alcohol/week: 2.0 standard drinks    Types: 1 Shots of liquor, 1 Standard drinks or equivalent per week    Comment: Rarely socially  . Drug use: No  .  Sexual activity: Not Currently    Comment: Once every 3 months with husband  Other Topics Concern  . Not on file  Social History Narrative   Married for four years. Married previously for 21 years. 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    Social Determinants of Health   Financial Resource Strain: Not on file  Food Insecurity: Not on file  Transportation Needs: Not on file  Physical Activity: Not on file  Stress: Not on file  Social Connections: Not on file  Intimate Partner  Violence: Not on file    ROS Per hpi   Objective   Vitals as reported by the patient: There were no vitals filed for this visit.  [name] was seen today for sore throat.  Diagnoses and all orders for this visit:  Seasonal allergies -     predniSONE (STERAPRED UNI-PAK 21 TAB) 10 MG (21) TBPK tablet; Take per package instructions. Do not skip doses. Finish entire supply. -     cetirizine (ZYRTEC) 10 MG tablet; Take 1 tablet (10 mg total) by mouth daily. -     azelastine (ASTELIN) 0.1 % nasal spray; Place 1 spray into both nostrils 2 (two) times daily. Use in each nostril as directed   PLAN  Suspect allergies  Prednisone taper, cetirizine, and azelastine  If no improvement in next 2-3 days consider in person eval or course of abx  Patient encouraged to call clinic with any questions, comments, or concerns.   I discussed the assessment and treatment plan with the patient. The patient was provided an opportunity to ask questions and all were answered. The patient agreed with the plan and demonstrated an understanding of the instructions.   The patient was advised to call back or seek an in-person evaluation if the symptoms worsen or if the condition fails to improve as anticipated.  I provided 15 minutes of non-face-to-face time during this encounter.  Janeece Agee, NP  Primary [facility] at [facility]

## 2021-03-07 NOTE — Patient Instructions (Signed)
° ° ° °  If you have lab work done today you will be contacted with your lab results within the next 2 weeks.  If you have not heard from us then please contact us. The fastest way to get your results is to register for My Chart. ° ° °IF you received an x-ray today, you will receive an invoice from [facility]. Please contact [facility] at [phone] with questions or concerns regarding your invoice.  ° °IF you received labwork today, you will receive an invoice from [facility]. Please contact [facility] at [phone] with questions or concerns regarding your invoice.  ° °Our billing staff will not be able to assist you with questions regarding bills from these companies. ° °You will be contacted with the lab results as soon as they are available. The fastest way to get your results is to activate your My Chart account. Instructions are located on the last page of this paperwork. If you have not heard from us regarding the results in 2 weeks, please contact this office. °  ° ° ° °

## 2021-03-13 ENCOUNTER — Ambulatory Visit
Admission: RE | Admit: 2021-03-13 | Discharge: 2021-03-13 | Disposition: A | Discharge status: Home / Self Care | Payer: Self-pay | Source: Ambulatory Visit | Attending: Registered Nurse | Admitting: Registered Nurse

## 2021-03-13 DIAGNOSIS — K219 Gastro-esophageal reflux disease without esophagitis: Secondary | ICD-10-CM

## 2021-03-13 DIAGNOSIS — R7989 Other specified abnormal findings of blood chemistry: Secondary | ICD-10-CM

## 2021-04-09 ENCOUNTER — Telehealth: Payer: Self-pay | Admitting: Registered Nurse

## 2021-04-09 NOTE — Telephone Encounter (Signed)
Patient would like to know how the ultrasound on her stomach went.  Please advise patient

## 2021-11-13 ENCOUNTER — Other Ambulatory Visit: Payer: Self-pay | Admitting: Registered Nurse

## 2021-11-13 DIAGNOSIS — K219 Gastro-esophageal reflux disease without esophagitis: Secondary | ICD-10-CM

## 2022-01-28 ENCOUNTER — Other Ambulatory Visit: Payer: Self-pay | Admitting: Registered Nurse

## 2022-01-28 DIAGNOSIS — F325 Major depressive disorder, single episode, in full remission: Secondary | ICD-10-CM

## 2022-01-28 NOTE — Telephone Encounter (Signed)
Will need OV before next refill

## 2022-01-30 ENCOUNTER — Other Ambulatory Visit: Payer: Self-pay

## 2022-01-30 DIAGNOSIS — G2581 Restless legs syndrome: Secondary | ICD-10-CM

## 2022-01-30 NOTE — Telephone Encounter (Signed)
Encourage patient to contact the pharmacy for refills or they can request refills through [facility]  (Please schedule appointment if patient has not been seen in over a year)    WHAT PHARMACY WOULD THEY LIKE THIS SENT TO: [id number] - [place], [place] - [address]   MEDICATION NAME & DOSE:ALPRAZolam (NIRAVAM) 0.5 MG dissolvable tablet   NOTES/COMMENTS FROM PATIENT:Pt is leaving Monday and needs this medication for travel I have explained to the patient this is not a current medication she said it is prescribed for travel       Front office please notify patient: It takes 48-72 hours to process rx refill requests Ask patient to call pharmacy to ensure rx is ready before heading there.

## 2022-01-30 NOTE — Telephone Encounter (Signed)
Patient is requesting a refill of the following medications: Requested Prescriptions   Pending Prescriptions Disp Refills   ALPRAZolam (NIRAVAM) 0.5 MG dissolvable tablet 10 tablet 0    Sig: Take 1 tablet (0.5 mg total) by mouth daily as needed (for restless legs).    Date of patient request: 01/30/2022 Last office visit: 02/19/2021 Date of last refill: 12/16/2020 Last refill amount: 10 tablets  Follow up time period per chart: none

## 2022-02-02 MED ORDER — ALPRAZOLAM 0.5 MG PO TBDP: 0.5000 mg | ORAL_TABLET | Freq: Every day | ORAL | 0 refills | Status: AC | PRN
